# Patient Record
Sex: Female | Born: 1985 | Race: White | Hispanic: No | Marital: Single | Smoking: Former smoker
Health system: Southern US, Community
[De-identification: ages and names within clinical notes are randomized; demographics above are authoritative.]

## PROBLEM LIST (undated history)

## (undated) DIAGNOSIS — N390 Urinary tract infection, site not specified: Secondary | ICD-10-CM

## (undated) DIAGNOSIS — K8020 Calculus of gallbladder without cholecystitis without obstruction: Secondary | ICD-10-CM

## (undated) DIAGNOSIS — N83209 Unspecified ovarian cyst, unspecified side: Secondary | ICD-10-CM

## (undated) DIAGNOSIS — I10 Essential (primary) hypertension: Secondary | ICD-10-CM

## (undated) DIAGNOSIS — O24419 Gestational diabetes mellitus in pregnancy, unspecified control: Secondary | ICD-10-CM

## (undated) DIAGNOSIS — N200 Calculus of kidney: Secondary | ICD-10-CM

## (undated) HISTORY — DX: Gestational diabetes mellitus in pregnancy, unspecified control: O24.419

## (undated) HISTORY — PX: FINGER SURGERY: SHX640

---
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
## 2007-08-10 ENCOUNTER — Emergency Department (HOSPITAL_COMMUNITY): Admission: EM | Admit: 2007-08-10 | Discharge: 2007-08-10 | Payer: Self-pay | Admitting: Emergency Medicine

## 2010-01-14 HISTORY — PX: AMPUTATION FINGER / THUMB: SUR24

## 2010-06-14 ENCOUNTER — Emergency Department (HOSPITAL_COMMUNITY): Payer: Self-pay

## 2010-06-14 ENCOUNTER — Observation Stay (HOSPITAL_COMMUNITY)
Admission: EM | Admit: 2010-06-14 | Discharge: 2010-06-15 | Disposition: A | Discharge status: Home / Self Care | Payer: Self-pay | Attending: Orthopaedic Surgery | Admitting: Orthopaedic Surgery

## 2010-06-14 DIAGNOSIS — R112 Nausea with vomiting, unspecified: Secondary | ICD-10-CM | POA: Insufficient documentation

## 2010-06-14 DIAGNOSIS — S61409A Unspecified open wound of unspecified hand, initial encounter: Secondary | ICD-10-CM | POA: Insufficient documentation

## 2010-06-14 DIAGNOSIS — Y9239 Other specified sports and athletic area as the place of occurrence of the external cause: Secondary | ICD-10-CM | POA: Insufficient documentation

## 2010-06-14 DIAGNOSIS — Y998 Other external cause status: Secondary | ICD-10-CM | POA: Insufficient documentation

## 2010-06-14 DIAGNOSIS — S6710XA Crushing injury of unspecified finger(s), initial encounter: Principal | ICD-10-CM | POA: Insufficient documentation

## 2010-06-14 DIAGNOSIS — F1210 Cannabis abuse, uncomplicated: Secondary | ICD-10-CM | POA: Insufficient documentation

## 2010-06-14 DIAGNOSIS — K589 Irritable bowel syndrome without diarrhea: Secondary | ICD-10-CM | POA: Insufficient documentation

## 2010-06-14 DIAGNOSIS — S62609A Fracture of unspecified phalanx of unspecified finger, initial encounter for closed fracture: Secondary | ICD-10-CM | POA: Insufficient documentation

## 2010-06-14 LAB — PREGNANCY, URINE: Preg Test, Ur: POSITIVE

## 2010-06-15 ENCOUNTER — Other Ambulatory Visit: Payer: Self-pay | Admitting: Orthopaedic Surgery
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
## 2010-07-16 ENCOUNTER — Inpatient Hospital Stay (HOSPITAL_COMMUNITY): Payer: Medicaid Other

## 2010-07-16 ENCOUNTER — Inpatient Hospital Stay (HOSPITAL_COMMUNITY)
Admission: AD | Admit: 2010-07-16 | Discharge: 2010-07-16 | Disposition: A | Discharge status: Home / Self Care | Payer: Medicaid Other | Source: Ambulatory Visit | Attending: Obstetrics & Gynecology | Admitting: Obstetrics & Gynecology

## 2010-07-16 DIAGNOSIS — O209 Hemorrhage in early pregnancy, unspecified: Secondary | ICD-10-CM | POA: Insufficient documentation

## 2010-07-16 LAB — CBC
Hemoglobin: 13.7 g/dL (ref 12.0–15.0)
MCH: 32.3 pg (ref 26.0–34.0)
MCHC: 35.5 g/dL (ref 30.0–36.0)
RDW: 11.7 % (ref 11.5–15.5)

## 2010-07-16 LAB — WET PREP, GENITAL: Yeast Wet Prep HPF POC: NONE SEEN

## 2010-07-16 LAB — HCG, QUANTITATIVE, PREGNANCY: hCG, Beta Chain, Quant, S: 85991 m[IU]/mL — ABNORMAL HIGH (ref <5)

## 2010-07-16 LAB — ABO/RH: ABO/RH(D): A POS

## 2010-07-17 LAB — GC/CHLAMYDIA PROBE AMP, GENITAL: Chlamydia, DNA Probe: NEGATIVE

## 2010-08-14 LAB — HEPATITIS B SURFACE ANTIGEN: Hepatitis B Surface Ag: NEGATIVE

## 2010-10-12 LAB — URINALYSIS, ROUTINE W REFLEX MICROSCOPIC
Ketones, ur: >80 — AB
Nitrite: NEGATIVE
Protein, ur: 100 — AB
pH: 6.5

## 2010-10-12 LAB — POCT PREGNANCY, URINE: Preg Test, Ur: NEGATIVE

## 2010-10-12 LAB — POCT I-STAT, CHEM 8
Calcium, Ion: 1.1 — ABNORMAL LOW
Creatinine, Ser: 0.7
Glucose, Bld: 197 — ABNORMAL HIGH
Potassium: 3.5
Sodium: 140

## 2010-10-12 LAB — HEPATIC FUNCTION PANEL
Bilirubin, Direct: 0.2
Indirect Bilirubin: 0.8
Total Bilirubin: 1

## 2010-10-12 LAB — URINE CULTURE
Colony Count: NO GROWTH
Culture: NO GROWTH

## 2010-10-12 LAB — URINE MICROSCOPIC-ADD ON

## 2010-10-12 LAB — LIPASE, BLOOD: Lipase: 11

## 2011-01-06 ENCOUNTER — Encounter (HOSPITAL_COMMUNITY): Payer: Self-pay

## 2011-01-06 ENCOUNTER — Inpatient Hospital Stay (HOSPITAL_COMMUNITY)
Admission: AD | Admit: 2011-01-06 | Discharge: 2011-01-06 | Disposition: A | Discharge status: Home / Self Care | Payer: Medicaid Other | Source: Ambulatory Visit | Attending: Obstetrics and Gynecology | Admitting: Obstetrics and Gynecology

## 2011-01-06 ENCOUNTER — Other Ambulatory Visit: Payer: Self-pay | Admitting: Obstetrics and Gynecology

## 2011-01-06 DIAGNOSIS — O4700 False labor before 37 completed weeks of gestation, unspecified trimester: Secondary | ICD-10-CM | POA: Insufficient documentation

## 2011-01-06 DIAGNOSIS — R030 Elevated blood-pressure reading, without diagnosis of hypertension: Secondary | ICD-10-CM | POA: Insufficient documentation

## 2011-01-06 DIAGNOSIS — O139 Gestational [pregnancy-induced] hypertension without significant proteinuria, unspecified trimester: Secondary | ICD-10-CM

## 2011-01-06 DIAGNOSIS — O99891 Other specified diseases and conditions complicating pregnancy: Secondary | ICD-10-CM | POA: Insufficient documentation

## 2011-01-06 HISTORY — DX: Calculus of gallbladder without cholecystitis without obstruction: K80.20

## 2011-01-06 HISTORY — DX: Essential (primary) hypertension: I10

## 2011-01-06 HISTORY — DX: Urinary tract infection, site not specified: N39.0

## 2011-01-06 HISTORY — DX: Calculus of kidney: N20.0

## 2011-01-06 LAB — COMPREHENSIVE METABOLIC PANEL
CO2: 22 mEq/L (ref 19–32)
Calcium: 8.8 mg/dL (ref 8.4–10.5)
Creatinine, Ser: 0.55 mg/dL (ref 0.50–1.10)
GFR calc Af Amer: >90 mL/min (ref >90)
GFR calc non Af Amer: >90 mL/min (ref >90)
Glucose, Bld: 92 mg/dL (ref 70–99)
Total Bilirubin: 0.3 mg/dL (ref 0.3–1.2)

## 2011-01-06 LAB — FETAL FIBRONECTIN: Fetal Fibronectin: NEGATIVE

## 2011-01-06 LAB — URINALYSIS, ROUTINE W REFLEX MICROSCOPIC
Glucose, UA: NEGATIVE mg/dL
Hgb urine dipstick: NEGATIVE
Protein, ur: NEGATIVE mg/dL
pH: 7 (ref 5.0–8.0)

## 2011-01-06 LAB — CBC
HCT: 35 % — ABNORMAL LOW (ref 36.0–46.0)
Hemoglobin: 12.3 g/dL (ref 12.0–15.0)
MCH: 32.3 pg (ref 26.0–34.0)
MCV: 91.9 fL (ref 78.0–100.0)
RBC: 3.81 MIL/uL — ABNORMAL LOW (ref 3.87–5.11)

## 2011-01-06 LAB — WET PREP, GENITAL: Trich, Wet Prep: NONE SEEN

## 2011-01-06 LAB — URIC ACID: Uric Acid, Serum: 2.6 mg/dL (ref 2.4–7.0)

## 2011-01-06 MED ORDER — NIFEDIPINE 10 MG PO CAPS
20.0000 mg | ORAL_CAPSULE | Freq: Once | ORAL | Status: AC | PRN
  Administered 2011-01-06: 20 mg via ORAL
  Filled 2011-01-06: qty 1
  Filled 2011-01-06: qty 2

## 2011-01-06 MED ORDER — NIFEDIPINE 10 MG PO CAPS
20.0000 mg | ORAL_CAPSULE | Freq: Once | ORAL | Status: AC
  Administered 2011-01-06: 20 mg via ORAL
  Filled 2011-01-06: qty 2

## 2011-01-06 MED ORDER — NIFEDIPINE ER OSMOTIC RELEASE 30 MG PO TB24: 30.0000 mg | ORAL_TABLET | Freq: Every day | ORAL | Status: DC

## 2011-01-06 NOTE — ED Provider Notes (Signed)
History   25 yo G1P0 at 73 5/7 weeks presented c/o contractions all day, now less strong but just as frequent.  Reports has had contractions frequently over the last few weeks.  Denies leaking, bleeding, HA, visual symptoms, or epigastric pain.  Reports +FM.  Denies recent IC.  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  Chief Complaint  Patient presents with  . Contractions    OB History    Grav Para Term Preterm Abortions TAB SAB Ect Mult Living   1               Past Medical History  Diagnosis Date  . UTI (lower urinary tract infection)   . Kidney stones   . Gall bladder stones   . Hypertension     Past Surgical History  Procedure Date  . Finger surgery     Family History  Problem Relation Age of Onset  . Hypertension Mother   . Alcohol abuse Father   . Diabetes Maternal Grandmother   . Arthritis Paternal Grandmother     History  Substance Use Topics  . Smoking status: Former Smoker -- 0.2 packs/day    Types: Cigarettes    Quit date: 01/05/2006  . Smokeless tobacco: Not on file  . Alcohol Use: No    Allergies: No Known Allergies  Prescriptions prior to admission  Medication Sig Dispense Refill  . calcium carbonate (TUMS - DOSED IN MG ELEMENTAL CALCIUM) 500 MG chewable tablet Chew 1 tablet by mouth daily.        . prenatal vitamin w/FE, FA (PRENATAL 1 + 1) 27-1 MG TABS Take 1 tablet by mouth daily.           Physical Exam   Blood pressure 152/102, pulse 103, temperature 98.4 F (36.9 C), resp. rate 16, height 4\' 11"  (1.499 m), weight 66.679 kg (147 lb).  Filed Vitals:   01/06/11 1506 01/06/11 1529 01/06/11 1540  BP: 154/67 152/100 152/102  Pulse: 100 97 103  Temp: 98.4 F (36.9 C)    Resp: 16 16   Height: 4\' 11"  (1.499 m)    Weight: 66.679 kg (147 lb)       Chest clear Heart RRR without murmur Abd gravid, NT Pelvic--thin white d/c in vault, cervix posterior, closed, vtx -2. Ext  DTR 2+ without clonus, no edema  FHR reactive, no decels UCs q 4 minutes, with some milder contractions between.     ED Course  IUP at 33 5/7 weeks PTL, without cervical change Elevated BP  Consulted with Dr. Estanislado Pandy. Procardia 20 mg po now--repeat q 20-30 minutes x 2 doses if contractions persist. GC, chlamydia, FFN, wet prep done. UA pending, culture sent. PIH labs  Nigel Bridgeman, CNM, [place] 01/06/11 4:15p

## 2011-01-06 NOTE — Progress Notes (Signed)
Onset of contraction since this morning, had some a few weeks ago, painful and regular, every 3 minutes for past hour, no vaginal bleeding , no discharge.

## 2011-01-06 NOTE — Progress Notes (Signed)
Pt says she had contractions@ 0530 this morning that felt strong menstrual cramps.  Now she says they are not strong but coming every three minutes.  No leaking or bleeding.

## 2011-01-06 NOTE — ED Provider Notes (Signed)
Late note: Received 3 doses of Procardia, with diminishing of UCs after 3rd dose.  Has moderate headache now. Desires to leave [facility]U and get something to eat.  Patient found walking down the hall with partner.  FHR reactive UCs largely resolved to irritability.  FFN negative. PIH labs WNL, except SGPT 36. Uric acid 2.6.  BPs were 130-140/67-76 after Procardia.  Consulted with Dr. Estanislado Pandy. D/C home with Rx for Procardia 30 mg XL, 1 po qday (start tomorrow) PIH and PTL precautions reviewed. Start 24 hour urine 01/08/11, and bring to office visit already scheduled on 01/09/11.  Nigel Bridgeman, CNM, [place] 01/06/11  11p

## 2011-01-07 LAB — URINE CULTURE
Colony Count: NO GROWTH
Culture  Setup Time: 201212232258
Culture: NO GROWTH
Special Requests: NORMAL

## 2011-01-10 LAB — CULTURE, BETA STREP (GROUP B ONLY): Special Requests: NORMAL

## 2011-01-11 ENCOUNTER — Encounter (HOSPITAL_COMMUNITY): Payer: Self-pay | Admitting: *Deleted

## 2011-01-11 ENCOUNTER — Inpatient Hospital Stay (HOSPITAL_COMMUNITY)
Admission: AD | Admit: 2011-01-11 | Discharge: 2011-01-12 | DRG: 782 | Disposition: A | Discharge status: Home / Self Care | Payer: Medicaid Other | Source: Ambulatory Visit | Attending: Obstetrics and Gynecology | Admitting: Obstetrics and Gynecology

## 2011-01-11 DIAGNOSIS — O139 Gestational [pregnancy-induced] hypertension without significant proteinuria, unspecified trimester: Principal | ICD-10-CM | POA: Diagnosis present

## 2011-01-11 DIAGNOSIS — O1490 Unspecified pre-eclampsia, unspecified trimester: Secondary | ICD-10-CM

## 2011-01-11 LAB — URINALYSIS, ROUTINE W REFLEX MICROSCOPIC
Glucose, UA: NEGATIVE mg/dL
Ketones, ur: 15 mg/dL — AB
Nitrite: NEGATIVE
Specific Gravity, Urine: 1.015 (ref 1.005–1.030)
pH: 7 (ref 5.0–8.0)

## 2011-01-11 LAB — URINE MICROSCOPIC-ADD ON

## 2011-01-11 LAB — CBC
Hemoglobin: 12.6 g/dL (ref 12.0–15.0)
MCH: 32.3 pg (ref 26.0–34.0)
Platelets: 304 10*3/uL (ref 150–400)
RBC: 3.9 MIL/uL (ref 3.87–5.11)
WBC: 11.9 10*3/uL — ABNORMAL HIGH (ref 4.0–10.5)

## 2011-01-11 LAB — LACTATE DEHYDROGENASE: LDH: 215 U/L (ref 94–250)

## 2011-01-11 LAB — DIFFERENTIAL
Eosinophils Absolute: 0.1 10*3/uL (ref 0.0–0.7)
Lymphocytes Relative: 16 % (ref 12–46)
Lymphs Abs: 1.9 10*3/uL (ref 0.7–4.0)
Monocytes Relative: 9 % (ref 3–12)
Neutrophils Relative %: 74 % (ref 43–77)

## 2011-01-11 LAB — COMPREHENSIVE METABOLIC PANEL
ALT: 52 U/L — ABNORMAL HIGH (ref 0–35)
AST: 35 U/L (ref 0–37)
Calcium: 8.9 mg/dL (ref 8.4–10.5)
Creatinine, Ser: 0.58 mg/dL (ref 0.50–1.10)
GFR calc Af Amer: >90 mL/min (ref >90)
Glucose, Bld: 93 mg/dL (ref 70–99)
Sodium: 137 mEq/L (ref 135–145)
Total Protein: 6.4 g/dL (ref 6.0–8.3)

## 2011-01-11 MED ORDER — PRENATAL MULTIVITAMIN CH
1.0000 | ORAL_TABLET | Freq: Every day | ORAL | Status: DC
  Administered 2011-01-12: 1 via ORAL
  Filled 2011-01-11: qty 1

## 2011-01-11 MED ORDER — NIFEDIPINE ER OSMOTIC RELEASE 30 MG PO TB24
30.0000 mg | ORAL_TABLET | Freq: Once | ORAL | Status: AC
  Administered 2011-01-11: 30 mg via ORAL
  Filled 2011-01-11: qty 1

## 2011-01-11 MED ORDER — ACETAMINOPHEN 325 MG PO TABS
650.0000 mg | ORAL_TABLET | ORAL | Status: DC | PRN
  Administered 2011-01-12: 650 mg via ORAL
  Filled 2011-01-11: qty 2

## 2011-01-11 MED ORDER — CALCIUM CARBONATE ANTACID 500 MG PO CHEW: 2.0000 | CHEWABLE_TABLET | ORAL | Status: DC | PRN

## 2011-01-11 MED ORDER — NIFEDIPINE ER 60 MG PO TB24
60.0000 mg | ORAL_TABLET | Freq: Every day | ORAL | Status: DC
  Administered 2011-01-12: 60 mg via ORAL
  Filled 2011-01-11: qty 2
  Filled 2011-01-11: qty 1

## 2011-01-11 MED ORDER — DOCUSATE SODIUM 100 MG PO CAPS
100.0000 mg | ORAL_CAPSULE | Freq: Every day | ORAL | Status: DC
  Administered 2011-01-12: 100 mg via ORAL
  Filled 2011-01-11: qty 1

## 2011-01-11 MED ORDER — LACTATED RINGERS IV SOLN
INTRAVENOUS | Status: DC
  Administered 2011-01-11 – 2011-01-12 (×2): via INTRAVENOUS

## 2011-01-11 MED ORDER — ZOLPIDEM TARTRATE 10 MG PO TABS
10.0000 mg | ORAL_TABLET | Freq: Every evening | ORAL | Status: DC | PRN
  Administered 2011-01-11: 10 mg via ORAL
  Filled 2011-01-11: qty 1

## 2011-01-11 NOTE — Progress Notes (Signed)
Patient states BP was elevated at routine office visit today and was sent here for follow up. States she did a 24 hour urine last week and the results came back normal yesterday.

## 2011-01-11 NOTE — Progress Notes (Signed)
Heather Daugherty is a 25 y.o. G1P0000 at [redacted]w[redacted]d for PIH evaluation sent from office with bp 144/106  148/104, denies ha, visual spots or blurring, no uc, srom, or vag bleeding, with swelling to hands only with +FM. BPP 8/8 today at office, on procardia xl give for contractions now increased dosage to 60 mg daily  Pregnancy complications elevated bp, 24 hour urine with protein 163 on 12/26 Problem list: Hx kidney stones Hx gall bladder stones Hx UTI   Objective: BP 136/94  Pulse 101  Temp(Src) 97.6 F (36.4 C) (Oral)  Resp 20      Abdomen: soft, gravid, nontender, BX x4 quad Uterine fundus: soft, nontender Skin & Color: warm and dry  Neurological: AOx3, DTRs +3 no clonus EXT: negative Homan's b/l, no edema  FHT:nst reactive UC:   regular, every 2-3 minutes mild SVE:      Labs: Lab Results  Component Value Date   WBC 11.9* 01/11/2011   HGB 12.6 01/11/2011   HCT 35.5* 01/11/2011   MCV 91.0 01/11/2011   PLT 304 01/11/2011   Results for orders placed during the hospital encounter of 01/11/11 (from the past 24 hour(s))  URINALYSIS, ROUTINE W REFLEX MICROSCOPIC     Status: Abnormal   Collection Time   01/11/11  1:45 PM      Component Value Range   Color, Urine YELLOW  YELLOW    APPearance HAZY (*) CLEAR    Specific Gravity, Urine 1.015  1.005 - 1.030    pH 7.0  5.0 - 8.0    Glucose, UA NEGATIVE  NEGATIVE (mg/dL)   Hgb urine dipstick NEGATIVE  NEGATIVE    Bilirubin Urine NEGATIVE  NEGATIVE    Ketones, ur 15 (*) NEGATIVE (mg/dL)   Protein, ur NEGATIVE  NEGATIVE (mg/dL)   Urobilinogen, UA 1.0  0.0 - 1.0 (mg/dL)   Nitrite NEGATIVE  NEGATIVE    Leukocytes, UA SMALL (*) NEGATIVE   URINE MICROSCOPIC-ADD ON     Status: Abnormal   Collection Time   01/11/11  1:45 PM      Component Value Range   Squamous Epithelial / LPF MANY (*) RARE    WBC, UA 3-6  <3 (WBC/hpf)   Bacteria, UA MANY (*) RARE   COMPREHENSIVE METABOLIC PANEL     Status: Abnormal   Collection Time   01/11/11   1:49 PM      Component Value Range   Sodium 137  135 - 145 (mEq/L)   Potassium 3.5  3.5 - 5.1 (mEq/L)   Chloride 103  96 - 112 (mEq/L)   CO2 24  19 - 32 (mEq/L)   Glucose, Bld 93  70 - 99 (mg/dL)   BUN 6  6 - 23 (mg/dL)   Creatinine, Ser 1.61  0.50 - 1.10 (mg/dL)   Calcium 8.9  8.4 - 09.6 (mg/dL)   Total Protein 6.4  6.0 - 8.3 (g/dL)   Albumin 2.6 (*) 3.5 - 5.2 (g/dL)   AST 35  0 - 37 (U/L)   ALT 52 (*) 0 - 35 (U/L)   Alkaline Phosphatase 141 (*) 39 - 117 (U/L)   Total Bilirubin 0.4  0.3 - 1.2 (mg/dL)   GFR calc non Af Amer >90  >90 (mL/min)   GFR calc Af Amer >90  >90 (mL/min)  URIC ACID     Status: Normal   Collection Time   01/11/11  1:49 PM      Component Value Range   Uric Acid, Serum 3.1  2.4 - 7.0 (mg/dL)  CBC     Status: Abnormal   Collection Time   01/11/11  1:49 PM      Component Value Range   WBC 11.9 (*) 4.0 - 10.5 (K/uL)   RBC 3.90  3.87 - 5.11 (MIL/uL)   Hemoglobin 12.6  12.0 - 15.0 (g/dL)   HCT 82.9 (*) 56.2 - 46.0 (%)   MCV 91.0  78.0 - 100.0 (fL)   MCH 32.3  26.0 - 34.0 (pg)   MCHC 35.5  30.0 - 36.0 (g/dL)   RDW 13.0  86.5 - 78.4 (%)   Platelets 304  150 - 400 (K/uL)  DIFFERENTIAL     Status: Abnormal   Collection Time   01/11/11  1:49 PM      Component Value Range   Neutrophils Relative 74  43 - 77 (%)   Neutro Abs 8.8 (*) 1.7 - 7.7 (K/uL)   Lymphocytes Relative 16  12 - 46 (%)   Lymphs Abs 1.9  0.7 - 4.0 (K/uL)   Monocytes Relative 9  3 - 12 (%)   Monocytes Absolute 1.0  0.1 - 1.0 (K/uL)   Eosinophils Relative 1  0 - 5 (%)   Eosinophils Absolute 0.1  0.0 - 0.7 (K/uL)   Basophils Relative 0  0 - 1 (%)   Basophils Absolute 0.0  0.0 - 0.1 (K/uL)  LACTATE DEHYDROGENASE     Status: Normal   Collection Time   01/11/11  1:49 PM      Component Value Range   LD 215  94 - 250 (U/L)   Results for orders placed during the hospital encounter of 01/11/11 (from the past 24 hour(s))  URINALYSIS, ROUTINE W REFLEX MICROSCOPIC     Status: Abnormal    Collection Time   01/11/11  1:45 PM      Component Value Range   Color, Urine YELLOW  YELLOW    APPearance HAZY (*) CLEAR    Specific Gravity, Urine 1.015  1.005 - 1.030    pH 7.0  5.0 - 8.0    Glucose, UA NEGATIVE  NEGATIVE (mg/dL)   Hgb urine dipstick NEGATIVE  NEGATIVE    Bilirubin Urine NEGATIVE  NEGATIVE    Ketones, ur 15 (*) NEGATIVE (mg/dL)   Protein, ur NEGATIVE  NEGATIVE (mg/dL)   Urobilinogen, UA 1.0  0.0 - 1.0 (mg/dL)   Nitrite NEGATIVE  NEGATIVE    Leukocytes, UA SMALL (*) NEGATIVE   URINE MICROSCOPIC-ADD ON     Status: Abnormal   Collection Time   01/11/11  1:45 PM      Component Value Range   Squamous Epithelial / LPF MANY (*) RARE    WBC, UA 3-6  <3 (WBC/hpf)   Bacteria, UA MANY (*) RARE   COMPREHENSIVE METABOLIC PANEL     Status: Abnormal   Collection Time   01/11/11  1:49 PM      Component Value Range   Sodium 137  135 - 145 (mEq/L)   Potassium 3.5  3.5 - 5.1 (mEq/L)   Chloride 103  96 - 112 (mEq/L)   CO2 24  19 - 32 (mEq/L)   Glucose, Bld 93  70 - 99 (mg/dL)   BUN 6  6 - 23 (mg/dL)   Creatinine, Ser 6.96  0.50 - 1.10 (mg/dL)   Calcium 8.9  8.4 - 29.5 (mg/dL)   Total Protein 6.4  6.0 - 8.3 (g/dL)   Albumin 2.6 (*) 3.5 - 5.2 (g/dL)   AST  35  0 - 37 (U/L)   ALT 52 (*) 0 - 35 (U/L)   Alkaline Phosphatase 141 (*) 39 - 117 (U/L)   Total Bilirubin 0.4  0.3 - 1.2 (mg/dL)   GFR calc non Af Amer >90  >90 (mL/min)   GFR calc Af Amer >90  >90 (mL/min)  URIC ACID     Status: Normal   Collection Time   01/11/11  1:49 PM      Component Value Range   Uric Acid, Serum 3.1  2.4 - 7.0 (mg/dL)  CBC     Status: Abnormal   Collection Time   01/11/11  1:49 PM      Component Value Range   WBC 11.9 (*) 4.0 - 10.5 (K/uL)   RBC 3.90  3.87 - 5.11 (MIL/uL)   Hemoglobin 12.6  12.0 - 15.0 (g/dL)   HCT 16.1 (*) 09.6 - 46.0 (%)   MCV 91.0  78.0 - 100.0 (fL)   MCH 32.3  26.0 - 34.0 (pg)   MCHC 35.5  30.0 - 36.0 (g/dL)   RDW 04.5  40.9 - 81.1 (%)   Platelets 304  150 - 400  (K/uL)  DIFFERENTIAL     Status: Abnormal   Collection Time   01/11/11  1:49 PM      Component Value Range   Neutrophils Relative 74  43 - 77 (%)   Neutro Abs 8.8 (*) 1.7 - 7.7 (K/uL)   Lymphocytes Relative 16  12 - 46 (%)   Lymphs Abs 1.9  0.7 - 4.0 (K/uL)   Monocytes Relative 9  3 - 12 (%)   Monocytes Absolute 1.0  0.1 - 1.0 (K/uL)   Eosinophils Relative 1  0 - 5 (%)   Eosinophils Absolute 0.1  0.0 - 0.7 (K/uL)   Basophils Relative 0  0 - 1 (%)   Basophils Absolute 0.0  0.0 - 0.1 (K/uL)  LACTATE DEHYDROGENASE     Status: Normal   Collection Time   01/11/11  1:49 PM      Component Value Range   LD 215  94 - 250 (U/L)    Assessment and Plan:  does not have a problem list on file. 34 3/7 week IUP Elevated bp   [facility], Yahira Timberman 01/11/2011, 3:23 PM
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
## 2011-01-12 LAB — COMPREHENSIVE METABOLIC PANEL
ALT: 48 U/L — ABNORMAL HIGH (ref 0–35)
AST: 27 U/L (ref 0–37)
Albumin: 2.4 g/dL — ABNORMAL LOW (ref 3.5–5.2)
Alkaline Phosphatase: 129 U/L — ABNORMAL HIGH (ref 39–117)
Chloride: 106 mEq/L (ref 96–112)
Potassium: 3.2 mEq/L — ABNORMAL LOW (ref 3.5–5.1)
Sodium: 138 mEq/L (ref 135–145)
Total Bilirubin: 0.4 mg/dL (ref 0.3–1.2)

## 2011-01-12 LAB — CBC
HCT: 33.1 % — ABNORMAL LOW (ref 36.0–46.0)
MCH: 32.3 pg (ref 26.0–34.0)
MCHC: 35.6 g/dL (ref 30.0–36.0)
MCV: 90.7 fL (ref 78.0–100.0)
RDW: 12.2 % (ref 11.5–15.5)
WBC: 11.5 10*3/uL — ABNORMAL HIGH (ref 4.0–10.5)

## 2011-01-12 LAB — DIFFERENTIAL
Basophils Absolute: 0 10*3/uL (ref 0.0–0.1)
Eosinophils Relative: 1 % (ref 0–5)
Lymphocytes Relative: 16 % (ref 12–46)
Monocytes Absolute: 1.1 10*3/uL — ABNORMAL HIGH (ref 0.1–1.0)

## 2011-01-12 LAB — CREATININE CLEARANCE, URINE, 24 HOUR
Collection Interval-CRCL: 24 hours
Creatinine, Urine: 56.79 mg/dL
Urine Total Volume-CRCL: 2250 mL

## 2011-01-12 MED ORDER — NIFEDIPINE ER 60 MG PO TB24: 60.0000 mg | ORAL_TABLET | Freq: Every day | ORAL | Status: DC

## 2011-01-12 NOTE — Progress Notes (Signed)
Discharge instructions reviewed with pt. Discharge papers signed. Prescription given. D/C'd from hospital via wheelchair, significant other present.

## 2011-01-12 NOTE — Discharge Summary (Signed)
Physician Discharge Summary  Patient ID: Heather Daugherty MRN: [id number] DOB/AGE: Jun 08, 1985 25 y.o.  Admit date: 01/11/2011 Discharge date: 01/12/2011  Admission Diagnoses:  Elevated BP at 34 3/7 weeks  Discharge Diagnoses:  Elevated BP Gestational Hypertension without evidence of pre-eclampsia Mild elevation of SGPT  Discharged Condition: stable  Hospital Course: Admitted from the office on 01/11/11 with elevated BP, for 24 hour urine.  Patient was already on Procardia XL 30 mg q day for preterm labor, as well as mildly elevated BP.  PIH labs showed mild elevation of SGPT, but remained stable on repeat.  NST remained reactive, with no significant contractions.  24 hour urine was resulted late on the evening of 01/12/11, with 180 mg protein/24 hour.  She was discharged home per Dr. Su Hilt, with gestational hypertension precautions.  She is to return on Monday, 01/14/11, for repeat [facility] labs, then start a 24 hour urine on Thursday, 01/17/11--return to office on Friday, 01/18/11, for BPP and BP re-check.  Consults: NA  Significant Diagnostic Studies: labs:  Results for orders placed during the hospital encounter of 01/11/11 (from the past 24 hour(s))  COMPREHENSIVE METABOLIC PANEL     Status: Abnormal   Collection Time   01/12/11 12:03 PM      Component Value Range   Sodium 138  135 - 145 (mEq/L)   Potassium 3.2 (*) 3.5 - 5.1 (mEq/L)   Chloride 106  96 - 112 (mEq/L)   CO2 24  19 - 32 (mEq/L)   Glucose, Bld 106 (*) 70 - 99 (mg/dL)   BUN 5 (*) 6 - 23 (mg/dL)   Creatinine, Ser 8.29  0.50 - 1.10 (mg/dL)   Calcium 8.9  8.4 - 56.2 (mg/dL)   Total Protein 6.0  6.0 - 8.3 (g/dL)   Albumin 2.4 (*) 3.5 - 5.2 (g/dL)   AST 27  0 - 37 (U/L)   ALT 48 (*) 0 - 35 (U/L)   Alkaline Phosphatase 129 (*) 39 - 117 (U/L)   Total Bilirubin 0.4  0.3 - 1.2 (mg/dL)   GFR calc non Af Amer >90  >90 (mL/min)   GFR calc Af Amer >90  >90 (mL/min)  URIC ACID     Status: Normal   Collection Time   01/12/11 12:03  PM      Component Value Range   Uric Acid, Serum 2.4  2.4 - 7.0 (mg/dL)  CBC     Status: Abnormal   Collection Time   01/12/11 12:03 PM      Component Value Range   WBC 11.5 (*) 4.0 - 10.5 (K/uL)   RBC 3.65 (*) 3.87 - 5.11 (MIL/uL)   Hemoglobin 11.8 (*) 12.0 - 15.0 (g/dL)   HCT 13.0 (*) 86.5 - 46.0 (%)   MCV 90.7  78.0 - 100.0 (fL)   MCH 32.3  26.0 - 34.0 (pg)   MCHC 35.6  30.0 - 36.0 (g/dL)   RDW 78.4  69.6 - 29.5 (%)   Platelets 281  150 - 400 (K/uL)  DIFFERENTIAL     Status: Abnormal   Collection Time   01/12/11 12:03 PM      Component Value Range   Neutrophils Relative 73  43 - 77 (%)   Neutro Abs 8.4 (*) 1.7 - 7.7 (K/uL)   Lymphocytes Relative 16  12 - 46 (%)   Lymphs Abs 1.9  0.7 - 4.0 (K/uL)   Monocytes Relative 10  3 - 12 (%)   Monocytes Absolute 1.1 (*) 0.1 - 1.0 (  K/uL)   Eosinophils Relative 1  0 - 5 (%)   Eosinophils Absolute 0.1  0.0 - 0.7 (K/uL)   Basophils Relative 0  0 - 1 (%)   Basophils Absolute 0.0  0.0 - 0.1 (K/uL)  LACTATE DEHYDROGENASE     Status: Normal   Collection Time   01/12/11 12:03 PM      Component Value Range   LD 150  94 - 250 (U/L)  CREATININE CLEARANCE, URINE, 24 HOUR     Status: Abnormal   Collection Time   01/12/11  7:40 PM      Component Value Range   Urine Total Volume-CRCL 2250     Collection Interval-CRCL 24     Creatinine, Urine 56.79     Creatinine 0.52  0.50 - 1.10 (mg/dL)   Creatinine, 16X Ur 0960  700 - 1800 (mg/day)   Creatinine Clearance 171 (*) 75 - 115 (mL/min)  PROTEIN, URINE, 24 HOUR     Status: Abnormal (Preliminary result)   Collection Time   01/12/11  7:40 PM      Component Value Range   Urine Total Volume-UPROT 2250     Collection Interval-UPROT 24     Protein, Urine PENDING     Protein, 24H Urine 180 (*) 50 - 100 (mg/day)     Treatments: NA  Discharge Exam: Blood pressure 130/80, pulse 104, temperature 98.3 F (36.8 C), temperature source Oral, resp. rate 19, height 4\' 11"  (1.499 m), weight 64.411 kg  (142 lb).  General appearance: alert Head: Normocephalic, without obvious abnormality, atraumatic Resp: clear to auscultation bilaterally Cardio: regular rate and rhythm, S1, S2 normal, no murmur, click, rub or gallop GI: soft, non-tender; bowel sounds normal; no masses,  no organomegaly Extremities: extremities normal, atraumatic, no cyanosis or edema  Disposition: Home or Self Care  Medication List  As of 01/12/2011 11:32 PM   CHANGE how you take these medications         NIFEdipine 60 MG 24 hr tablet   Commonly known as: PROCARDIA-XL/ADALAT CC   Take 1 tablet (60 mg total) by mouth daily.   What changed: - medication strength - dose         CONTINUE taking these medications         calcium carbonate 500 MG chewable tablet   Commonly known as: TUMS - dosed in mg elemental calcium      prenatal vitamin w/FE, FA 27-1 MG Tabs          Where to get your medications    These are the prescriptions that you need to pick up.   You may get these medications from any pharmacy.         NIFEdipine 60 MG 24 hr tablet           Follow-up Information    Follow up with [facility] on 01/14/2011. (Come to the office on Monday, 01/14/11 for repeat blood work.  Office will schedule another visit for you on Friday, 01/18/11 for biophysical profile and 24 hour urine.  Start the 24 hour urine on Thursday, 01/17/11.)          Signed: Nigel Bridgeman 01/12/2011, 11:32 PM

## 2011-01-12 NOTE — Progress Notes (Addendum)
Heather Daugherty is a 25 y.o. G1P0000 at [redacted]w[redacted]d [facility]  Hospital Day No:1  Subjective: denie ha, visual spots or blurring, no swelling, no contra   Pregnancy complications: pre-eclampsia  Objective: BP 112/93  Pulse 105  Temp(Src) 98 F (36.7 C) (Oral)  Resp 20  Ht 4\' 11"  (1.499 m)  Wt 142 lb (64.411 kg)  BMI 28.68 kg/m2      Physical Exam:  Gen: alert, cooperative Chest/Lungs: cta bilaterally  Heart/Pulse: RRR  Abdomen: soft, gravid, nontender, BX x4 quad Uterine fundus: soft, nontender Skin & Color: warm and dry  Neurological: AOx3, DTRs +2 EXT: negative Homan's b/l, no edema  FHT:  NST reactive few contractions SVE:   Dilation: Closed Effacement (%): 70 Exam by:: Hassell Halim, CNM  Labs: Lab Results  Component Value Date   WBC 11.9* 01/11/2011   HGB 12.6 01/11/2011   HCT 35.5* 01/11/2011   MCV 91.0 01/11/2011   PLT 304 01/11/2011    Assessment and Plan: 34 34/7 week IUP Pre eclampsia Eval with elevated BPs P labs for 1200 PIH panel, NST q shift  KREBSBACH, MARY 01/12/2011, 10:43 AM  Agree with above. 24hr urine pending. - AYR

## 2011-01-13 LAB — PROTEIN, URINE, 24 HOUR
Protein, 24H Urine: 180 mg/d — ABNORMAL HIGH (ref 50–100)
Protein, Urine: 8 mg/dL

## 2011-01-14 ENCOUNTER — Inpatient Hospital Stay (HOSPITAL_COMMUNITY)
Admission: AD | Admit: 2011-01-14 | Discharge: 2011-01-14 | Disposition: A | Discharge status: Home / Self Care | Payer: Medicaid Other | Source: Ambulatory Visit | Attending: Obstetrics and Gynecology | Admitting: Obstetrics and Gynecology

## 2011-01-14 ENCOUNTER — Encounter (HOSPITAL_COMMUNITY): Payer: Self-pay | Admitting: *Deleted

## 2011-01-14 DIAGNOSIS — O139 Gestational [pregnancy-induced] hypertension without significant proteinuria, unspecified trimester: Secondary | ICD-10-CM | POA: Insufficient documentation

## 2011-01-14 DIAGNOSIS — O4700 False labor before 37 completed weeks of gestation, unspecified trimester: Secondary | ICD-10-CM | POA: Insufficient documentation

## 2011-01-14 LAB — URINALYSIS, ROUTINE W REFLEX MICROSCOPIC
Hgb urine dipstick: NEGATIVE
Nitrite: NEGATIVE
Specific Gravity, Urine: 1.01 (ref 1.005–1.030)
Urobilinogen, UA: 0.2 mg/dL (ref 0.0–1.0)

## 2011-01-14 LAB — CBC
HCT: 33.9 % — ABNORMAL LOW (ref 36.0–46.0)
Hemoglobin: 11.9 g/dL — ABNORMAL LOW (ref 12.0–15.0)
MCH: 32.1 pg (ref 26.0–34.0)
MCHC: 35.1 g/dL (ref 30.0–36.0)

## 2011-01-14 LAB — COMPREHENSIVE METABOLIC PANEL
ALT: 39 U/L — ABNORMAL HIGH (ref 0–35)
AST: 24 U/L (ref 0–37)
Calcium: 9.9 mg/dL (ref 8.4–10.5)
GFR calc Af Amer: >90 mL/min (ref >90)
Sodium: 136 mEq/L (ref 135–145)
Total Protein: 5.8 g/dL — ABNORMAL LOW (ref 6.0–8.3)

## 2011-01-14 LAB — URIC ACID: Uric Acid, Serum: 2.5 mg/dL (ref 2.4–7.0)

## 2011-01-14 MED ORDER — NIFEDIPINE ER OSMOTIC RELEASE 30 MG PO TB24: 60.0000 mg | ORAL_TABLET | Freq: Every day | ORAL | Status: DC

## 2011-01-14 MED ORDER — PROMETHAZINE HCL 25 MG/ML IJ SOLN: 12.5000 mg | Freq: Once | INTRAMUSCULAR | Status: DC | PRN

## 2011-01-14 MED ORDER — NIFEDIPINE ER OSMOTIC RELEASE 30 MG PO TB24
60.0000 mg | ORAL_TABLET | Freq: Once | ORAL | Status: AC
  Administered 2011-01-14: 60 mg via ORAL
  Filled 2011-01-14: qty 2

## 2011-01-14 MED ORDER — PROMETHAZINE HCL 25 MG/ML IJ SOLN
12.5000 mg | Freq: Four times a day (QID) | INTRAMUSCULAR | Status: DC | PRN
  Administered 2011-01-14: 12.5 mg via INTRAVENOUS
  Filled 2011-01-14: qty 1

## 2011-01-14 MED ORDER — TERBUTALINE SULFATE 1 MG/ML IJ SOLN
0.2500 mg | Freq: Once | INTRAMUSCULAR | Status: AC
  Administered 2011-01-14: 0.25 mg via SUBCUTANEOUS
  Filled 2011-01-14: qty 1

## 2011-01-14 MED ORDER — HYDROXYZINE HCL 50 MG/ML IM SOLN
100.0000 mg | Freq: Once | INTRAMUSCULAR | Status: AC
  Administered 2011-01-14: 100 mg via INTRAMUSCULAR
  Filled 2011-01-14: qty 2

## 2011-01-14 MED ORDER — LACTATED RINGERS IV BOLUS (SEPSIS)
1000.0000 mL | Freq: Once | INTRAVENOUS | Status: AC
  Administered 2011-01-14: 1000 mL via INTRAVENOUS

## 2011-01-14 MED ORDER — HYDROXYZINE PAMOATE 50 MG PO CAPS: 50.0000 mg | ORAL_CAPSULE | Freq: Four times a day (QID) | ORAL | Status: AC | PRN

## 2011-01-14 NOTE — Progress Notes (Signed)
Pt C/O legs aching, being very restless & crampy - CNM notified, is ordering vistaril IM.

## 2011-01-14 NOTE — ED Notes (Signed)
24-hour urine supplies & instructions given, pt verbalizes understanding.

## 2011-01-14 NOTE — Progress Notes (Signed)
Pt states that she has been feeling contractions since 0130
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
## 2011-01-14 NOTE — Progress Notes (Addendum)
S: legs feel better after vistaril, occ feels ctx, has been sleeping, denies HA/N/V/RUQ pain, GFM O:  Filed Vitals:   01/14/11 0438 01/14/11 0731 01/14/11 0823 01/14/11 0907  BP: 128/73 137/80 132/78 133/80  Pulse: 87 97 95 102  Temp:      TempSrc:      Resp:      Height:      Weight:      SpO2:       Labs:  ALT - 24 AST - 39 - decreased from 2 days ago  All others WNL  Attempt to recheck cx, pt did not tolerate exam, unable to feel cx, or presenting part  A: IUP at [redacted]w[redacted]d GHTN vs. Pre-eclampsia - on procardia XL 60mg   Preterm ctx  Restless legs - likely reaction from phenergan - resolved after vistaril Elevated LFT - stable 24hr urine on 12/29 = 180mg  protein UA for protein today - neg  P: D/C home Begin 24hr urine collection tmrw and turn into office on Wednesday appt at office on Friday Continue bedrest at home [facility] Labor precautions PIH precautions  D/W Dr. Normand Sloop

## 2011-01-15 NOTE — L&D Delivery Note (Signed)
Delivery Note G1P0 37 1/7 week IUP induction for pre eclampsia with cervidil, pitocin, arm clear fluid. At 8:14 AM a viable female was delivered via Vaginal, Spontaneous Delivery (Presentation: Left Occiput Anterior), in lithotomy position, Dr. Normand Sloop attended delivery.APGAR: 8, 9; weight 7 lb 3.2 oz (3266 g).   Placenta status: Intact, Spontaneous, Schultze.  Cord: 3 vessels. Cytotec 1000 rectally. Continue Magnesium 2 gram per hour.   Anesthesia: Epidural  Episiotomy: None Lacerations: 2nd degree;Perineal Suture Repair: 3.0 monocryl Est. Blood Loss (mL): 500  Mom to AICU.  Baby to with mother.  Heather Daugherty 01/30/2011, 8:56 AM

## 2011-01-22 ENCOUNTER — Inpatient Hospital Stay (HOSPITAL_COMMUNITY)
Admission: AD | Admit: 2011-01-22 | Discharge: 2011-02-01 | DRG: 774 | Disposition: A | Discharge status: Home / Self Care | Payer: Medicaid Other | Source: Ambulatory Visit | Attending: Obstetrics and Gynecology | Admitting: Obstetrics and Gynecology

## 2011-01-22 ENCOUNTER — Encounter (HOSPITAL_COMMUNITY): Payer: Self-pay | Admitting: *Deleted

## 2011-01-22 DIAGNOSIS — O9903 Anemia complicating the puerperium: Secondary | ICD-10-CM | POA: Diagnosis not present

## 2011-01-22 DIAGNOSIS — O139 Gestational [pregnancy-induced] hypertension without significant proteinuria, unspecified trimester: Secondary | ICD-10-CM

## 2011-01-22 DIAGNOSIS — D649 Anemia, unspecified: Secondary | ICD-10-CM | POA: Diagnosis not present

## 2011-01-22 DIAGNOSIS — O1490 Unspecified pre-eclampsia, unspecified trimester: Secondary | ICD-10-CM | POA: Diagnosis present

## 2011-01-22 DIAGNOSIS — IMO0002 Reserved for concepts with insufficient information to code with codable children: Principal | ICD-10-CM | POA: Diagnosis present

## 2011-01-22 MED ORDER — DOCUSATE SODIUM 100 MG PO CAPS
100.0000 mg | ORAL_CAPSULE | Freq: Every day | ORAL | Status: DC
  Administered 2011-01-22 – 2011-01-28 (×7): 100 mg via ORAL
  Filled 2011-01-22 (×9): qty 1

## 2011-01-22 MED ORDER — PANTOPRAZOLE SODIUM 40 MG PO TBEC
40.0000 mg | DELAYED_RELEASE_TABLET | Freq: Every day | ORAL | Status: DC
  Administered 2011-01-22 – 2011-01-28 (×7): 40 mg via ORAL
  Filled 2011-01-22 (×12): qty 1

## 2011-01-22 MED ORDER — ACETAMINOPHEN 325 MG PO TABS
650.0000 mg | ORAL_TABLET | ORAL | Status: DC | PRN
  Administered 2011-01-23: 650 mg via ORAL
  Filled 2011-01-22: qty 2

## 2011-01-22 MED ORDER — ZOLPIDEM TARTRATE 10 MG PO TABS
10.0000 mg | ORAL_TABLET | Freq: Every evening | ORAL | Status: DC | PRN
  Administered 2011-01-22 – 2011-01-28 (×7): 10 mg via ORAL
  Filled 2011-01-22 (×7): qty 1

## 2011-01-22 MED ORDER — POTASSIUM CHLORIDE CRYS ER 20 MEQ PO TBCR
40.0000 meq | EXTENDED_RELEASE_TABLET | Freq: Every day | ORAL | Status: DC
  Administered 2011-01-22 – 2011-01-28 (×6): 40 meq via ORAL
  Filled 2011-01-22 (×9): qty 2

## 2011-01-22 MED ORDER — CALCIUM CARBONATE ANTACID 500 MG PO CHEW
2.0000 | CHEWABLE_TABLET | ORAL | Status: DC | PRN
  Filled 2011-01-22: qty 2

## 2011-01-22 MED ORDER — PRENATAL MULTIVITAMIN CH
1.0000 | ORAL_TABLET | Freq: Every day | ORAL | Status: DC
  Administered 2011-01-22 – 2011-01-28 (×7): 1 via ORAL
  Filled 2011-01-22 (×9): qty 1
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
## 2011-01-22 NOTE — Progress Notes (Signed)
Patient sent from the office for evaluation of elevated BP. Patient is having no pain, bleeding or leaking and reports good fetal movement.

## 2011-01-22 NOTE — Progress Notes (Signed)
Heather Daugherty is a 26 y.o. G1P0000 at [redacted]w[redacted]d admitted for elevated BP.  Subjective: Pt now transferred to antenatal and resting and BP has decreased w/ bedrest.  No PIH s/s.  S.o. Reports "her heart burn has been really bad."  GFM.  No LOF or abnl d/c.    Objective: BP 136/94  Pulse 97  Temp(Src) 99.2 F (37.3 C) (Oral)  Resp 18  Ht 4\' 11"  (1.499 m)  Wt 68.04 kg (150 lb)  BMI 30.30 kg/m2     .Marland Kitchen Filed Vitals:   01/22/11 1352 01/22/11 1407 01/22/11 1446 01/22/11 1502  BP: 140/103 143/100 146/98 136/94  Pulse: 104 96 97 97  Temp:   99.2 F (37.3 C)   TempSrc:   Oral   Resp:   18 18  Height:   4\' 11"  (1.499 m)   Weight:   68.04 kg (150 lb)    FHT:  FHR: 140 bpm, variability: moderate,  accelerations:  Present,  decelerations:  Absent UC:   Irregular--occ'l ctxs w/ intermittent UI SVE:   Dilation: 1.5 Effacement (%): 50 Exam by:: h.Uri Covey,cnm  Labs: [facility] called w/ PIH labs and 24 hr urine result just after 1500. CBC WNL:  WBC=11.8, Hgb=12.8, Hct=36.2, & Platelets=335; CMET WNL except K=3.3; Alkaline phosphatate=140; Albumin =2.6(L)  (AST=25, ALT=30); glucose=138 but unsure last meal time LDH=191 Uric acid=3.4  24 hr urine results: 24 hr total protein=435 (H) 24 hr urine volume=2,966ml 24hr creatinine=1,694(H) Total protein=15  Assessment / Plan: 1.  IUP at 36 weeks  2.  Mild Preeclampsia  3.  Cat I FHT  4. Hypokalemia  5. Normal LFTs  Preeclampsia:  mild preE Fetal Wellbeing:  Category I Anticipated MOD:  NSVD 1.  Per c/w dr. Su Hilt, admit as inpatient to remain hospitalized until delivery.  Consult w/ MFM r/e time of delivery. 2.  Daily PIH labs 3.  Continue Procardia XL 60mg  po qd 4.  KDur po qd until K level normal 5.  MD to follow 6.  BPP this Friday  Abram Sax H 01/22/2011, 3:50 PM

## 2011-01-23 ENCOUNTER — Inpatient Hospital Stay (HOSPITAL_COMMUNITY): Payer: Medicaid Other

## 2011-01-23 LAB — RPR: RPR: NONREACTIVE

## 2011-01-23 LAB — COMPREHENSIVE METABOLIC PANEL
Alkaline Phosphatase: 136 U/L — ABNORMAL HIGH (ref 39–117)
BUN: 6 mg/dL (ref 6–23)
CO2: 25 mEq/L (ref 19–32)
Chloride: 104 mEq/L (ref 96–112)
GFR calc Af Amer: >90 mL/min (ref >90)
GFR calc non Af Amer: >90 mL/min (ref >90)
Glucose, Bld: 86 mg/dL (ref 70–99)
Potassium: 3.5 mEq/L (ref 3.5–5.1)
Total Bilirubin: 0.3 mg/dL (ref 0.3–1.2)
Total Protein: 6 g/dL (ref 6.0–8.3)

## 2011-01-23 LAB — LACTATE DEHYDROGENASE: LDH: 151 U/L (ref 94–250)

## 2011-01-23 LAB — URIC ACID: Uric Acid, Serum: 2.5 mg/dL (ref 2.4–7.0)

## 2011-01-23 LAB — CBC
HCT: 33.8 % — ABNORMAL LOW (ref 36.0–46.0)
Hemoglobin: 12 g/dL (ref 12.0–15.0)
MCHC: 35.5 g/dL (ref 30.0–36.0)

## 2011-01-23 MED ORDER — HYDROCODONE-ACETAMINOPHEN 5-325 MG PO TABS
2.0000 | ORAL_TABLET | Freq: Once | ORAL | Status: AC
  Administered 2011-01-23: 2 via ORAL
  Filled 2011-01-23: qty 2

## 2011-01-23 MED ORDER — NIFEDIPINE ER 60 MG PO TB24
60.0000 mg | ORAL_TABLET | Freq: Every day | ORAL | Status: DC
  Administered 2011-01-23 – 2011-01-28 (×6): 60 mg via ORAL
  Filled 2011-01-23 (×8): qty 1

## 2011-01-23 NOTE — Progress Notes (Signed)
Monitors off. 

## 2011-01-23 NOTE — Progress Notes (Signed)
MFM CONSULTATION   Patient is 26 y/o/ G1 admitted with a diagnosis of preeclampsia.  Patient has experienced increased blood pressures for several weeks.  Serial evaluations of 24 hour urine have been negative until sample yesterday reveal 435 mg total protein.  Patient gives no prior history of HTN.  Procardia was initially started for symptomatic relief of preterm contractions but was recently changed to 60 mg XL daily for BP control.  Patient denies any symptoms of preeclampsia with the exception of peripheral edema.  On the day of admission, patient was noted to have markedly elevated BP in the range of 160/100.  Since admission, these have ranged predominantly 120-140's/70-100.  Preeclampsia labs are WNL.  Exam is unremarkable.  Recent ultrasound 12/28 revealed normal fetal growth (79th percentile) and normal amniotic fluid volume.  Fetal monitoring has been reported as category 1.   Ultrasound was performed today to assess amniotic fluid volume.  This was WNL.    Diagnosis of preeclampsia was discussed with the patient at length as well as management strategies.  Recommendations:  -continue close inpatient maternal and fetal surveillance -if maternal and fetal status remain stable, recommend delivery at 37 weeks -maintain low threshold to proceed with delivery  -deliver for any evidence of severe preeclampsia including persistent increases in SBP >160 or DBP>100, neurologic symptoms, epigastric pain, oliguria, lab abnormalities, hyperreflexia, or any maternal or fetal concerns -evaluate labs every other day for now with more frequent evaluations based on clinical status -would not increase antihypertensive therapy further, if BP continues to trend up, proceed with delivery -agree with plan to transition from continuous fetal monitoring to TID monitoring with further prolonged monitoring as indicated  Thanks for consult. Will see patient again at your request.  30 min

## 2011-01-23 NOTE — Progress Notes (Signed)
UR chart review completed.  

## 2011-01-23 NOTE — Progress Notes (Addendum)
Hospital day # 1 pregnancy at [redacted]w[redacted]d  S: Well, reports good fetal activity.  Denies HA, visual symptoms, epigastric pain.  Family at bedside.      Contractions: Very occasional, mild      Vaginal bleeding: None       Vaginal discharge: None  O: BP 131/87  Pulse 93  Temp(Src) 98.3 F (36.8 C) (Oral)  Resp 18  Ht 4\' 11"  (1.499 m)  Wt 68.04 kg (150 lb)  BMI 30.30 kg/m2  Filed Vitals:   01/23/11 0126 01/23/11 0549 01/23/11 0813 01/23/11 1005  BP: 131/87 127/75 143/102 131/87  Pulse: 102 94 87 93  Temp: 98.2 F (36.8 C) 98 F (36.7 C) 98.3 F (36.8 C)   TempSrc:  Oral Oral   Resp: 18 18 18 18   Height:      Weight:       Results for orders placed during the hospital encounter of 01/22/11 (from the past 24 hour(s))  CBC     Status: Abnormal   Collection Time   01/23/11  8:53 AM      Component Value Range   WBC 11.1 (*) 4.0 - 10.5 (K/uL)   RBC 3.73 (*) 3.87 - 5.11 (MIL/uL)   Hemoglobin 12.0  12.0 - 15.0 (g/dL)   HCT 47.8 (*) 29.5 - 46.0 (%)   MCV 90.6  78.0 - 100.0 (fL)   MCH 32.2  26.0 - 34.0 (pg)   MCHC 35.5  30.0 - 36.0 (g/dL)   RDW 62.1  30.8 - 65.7 (%)   Platelets 304  150 - 400 (K/uL)  COMPREHENSIVE METABOLIC PANEL     Status: Abnormal   Collection Time   01/23/11  8:53 AM      Component Value Range   Sodium 136  135 - 145 (mEq/L)   Potassium 3.5  3.5 - 5.1 (mEq/L)   Chloride 104  96 - 112 (mEq/L)   CO2 25  19 - 32 (mEq/L)   Glucose, Bld 86  70 - 99 (mg/dL)   BUN 6  6 - 23 (mg/dL)   Creatinine, Ser 8.46  0.50 - 1.10 (mg/dL)   Calcium 8.3 (*) 8.4 - 10.5 (mg/dL)   Total Protein 6.0  6.0 - 8.3 (g/dL)   Albumin 2.4 (*) 3.5 - 5.2 (g/dL)   AST 23  0 - 37 (U/L)   ALT 30  0 - 35 (U/L)   Alkaline Phosphatase 136 (*) 39 - 117 (U/L)   Total Bilirubin 0.3  0.3 - 1.2 (mg/dL)   GFR calc non Af Amer >90  >90 (mL/min)   GFR calc Af Amer >90  >90 (mL/min)  LACTATE DEHYDROGENASE     Status: Normal   Collection Time   01/23/11  8:53 AM      Component Value Range   LD 151  94 -  250 (U/L)  URIC ACID     Status: Normal   Collection Time   01/23/11  8:53 AM      Component Value Range   Uric Acid, Serum 2.5  2.4 - 7.0 (mg/dL)         Fetal tracings: Reviewed and reassuring      Uterus non-tender      Extremities: no significant edema and no signs of DVT, DTR 2-3+ without clonus.  A: [redacted]w[redacted]d with pre-eclampsia     Stable     Normal PIH labs (previous mild elevation of ALT)  P: Continue current plan of care.     Await MFM  consult.     Multiple questions reviewed with patient and family regarding plan of care.     [facility] MDs will follow.  Nigel Bridgeman, CNM, [place] 01/23/2011 10:19 AM    pt seen and agree with above.  Awaiting [place] and MFM input

## 2011-01-24 LAB — CBC
Hemoglobin: 11.2 g/dL — ABNORMAL LOW (ref 12.0–15.0)
MCHC: 34.8 g/dL (ref 30.0–36.0)
RBC: 3.52 MIL/uL — ABNORMAL LOW (ref 3.87–5.11)
WBC: 10 10*3/uL (ref 4.0–10.5)

## 2011-01-24 LAB — COMPREHENSIVE METABOLIC PANEL
ALT: 30 U/L (ref 0–35)
Alkaline Phosphatase: 137 U/L — ABNORMAL HIGH (ref 39–117)
Chloride: 106 mEq/L (ref 96–112)
GFR calc Af Amer: >90 mL/min (ref >90)
Glucose, Bld: 83 mg/dL (ref 70–99)
Potassium: 3.2 mEq/L — ABNORMAL LOW (ref 3.5–5.1)
Sodium: 136 mEq/L (ref 135–145)
Total Bilirubin: 0.2 mg/dL — ABNORMAL LOW (ref 0.3–1.2)
Total Protein: 5.6 g/dL — ABNORMAL LOW (ref 6.0–8.3)

## 2011-01-24 NOTE — Progress Notes (Signed)
26 y.o. year old female,at [redacted]w[redacted]d gestation.  SUBJECTIVE:  Patient denies headaches, blurred vision, and right upper quadrant pain.  OBJECTIVE:  BP 131/83  Pulse 82  Temp(Src) 98 F (36.7 C) (Oral)  Resp 18  Ht 4\' 11"  (1.499 m)  Wt 65.363 kg (144 lb 1.6 oz)  BMI 29.10 kg/m2  CBC    Component Value Date/Time   WBC 10.0 01/24/2011 0445   RBC 3.52* 01/24/2011 0445   HGB 11.2* 01/24/2011 0445   HCT 32.2* 01/24/2011 0445   PLT 311 01/24/2011 0445   MCV 91.5 01/24/2011 0445   MCH 31.8 01/24/2011 0445   MCHC 34.8 01/24/2011 0445   RDW 12.4 01/24/2011 0445   LYMPHSABS 1.9 01/12/2011 1203   MONOABS 1.1* 01/12/2011 1203   EOSABS 0.1 01/12/2011 1203   BASOSABS 0.0 01/12/2011 1203    CMP     Component Value Date/Time   NA 136 01/24/2011 0445   K 3.2* 01/24/2011 0445   CL 106 01/24/2011 0445   CO2 23 01/24/2011 0445   GLUCOSE 83 01/24/2011 0445   BUN 7 01/24/2011 0445   CREATININE 0.54 01/24/2011 0445   CREATININE 0.52 01/12/2011 1940   CALCIUM 7.9* 01/24/2011 0445   PROT 5.6* 01/24/2011 0445   ALBUMIN 2.2* 01/24/2011 0445   AST 21 01/24/2011 0445   ALT 30 01/24/2011 0445   ALKPHOS 137* 01/24/2011 0445   BILITOT 0.2* 01/24/2011 0445   GFRNONAA >90 01/24/2011 0445   GFRAA >90 01/24/2011 0445   Fetal Heart Tones:  Category 1  Contractions:  Few  Chest: Clear  Heart: Regular rate and rhythm  Abdomen: Gravid and nontender  Extremities: Normal  Reflexes: Normal  ASSESSMENT:  [redacted]w[redacted]d Weeks Pregnancy  Preeclampsia (stable)  PLAN:  We will continue hospital observation. The plan is to deliver the patient at 37 weeks if she remained stable until that time.  Leonard Schwartz, M.D.

## 2011-01-25 ENCOUNTER — Inpatient Hospital Stay (HOSPITAL_COMMUNITY): Payer: Medicaid Other

## 2011-01-25 LAB — COMPREHENSIVE METABOLIC PANEL
Alkaline Phosphatase: 134 U/L — ABNORMAL HIGH (ref 39–117)
BUN: 7 mg/dL (ref 6–23)
Chloride: 104 mEq/L (ref 96–112)
GFR calc Af Amer: >90 mL/min (ref >90)
GFR calc non Af Amer: >90 mL/min (ref >90)
Glucose, Bld: 79 mg/dL (ref 70–99)
Potassium: 3.8 mEq/L (ref 3.5–5.1)
Total Bilirubin: 0.3 mg/dL (ref 0.3–1.2)

## 2011-01-25 LAB — CBC
HCT: 32.5 % — ABNORMAL LOW (ref 36.0–46.0)
Hemoglobin: 11.3 g/dL — ABNORMAL LOW (ref 12.0–15.0)
WBC: 11.4 10*3/uL — ABNORMAL HIGH (ref 4.0–10.5)

## 2011-01-25 LAB — LACTATE DEHYDROGENASE: LDH: 172 U/L (ref 94–250)

## 2011-01-25 NOTE — Progress Notes (Signed)
Patient ID: Heather Daugherty, female   DOB: [birth year], 26 y.o.   MRN: [id number] Pt without complaints.  No leakage of fluid or VB.  Good FM.  Denies headache, visual changes or RUQ pain  BP 143/97  Pulse 89  Temp(Src) 98 F (36.7 C) (Oral)  Resp 20  Ht 4\' 11"  (1.499 m)  Wt 65.363 kg (144 lb 1.6 oz)  BMI 29.10 kg/m2  FHTS Baseline: 130 bpm  Toco irregular  Pt in NAD CV RRR Lungs CTAB abd  Gravid soft and NT GU no vb EXt no calf tenderness  2 puls DTR B, no clonus B Results for orders placed during the hospital encounter of 01/22/11 (from the past 72 hour(s))  HIV ANTIBODY (ROUTINE TESTING)     Status: Normal      Component Value Range Comment   HIV Non-reactive     RPR     Status: Normal      Component Value Range Comment   RPR Nonreactive     CBC     Status: Abnormal   Collection Time   01/23/11  8:53 AM      Component Value Range Comment   WBC 11.1 (*) 4.0 - 10.5 (K/uL)    RBC 3.73 (*) 3.87 - 5.11 (MIL/uL)    Hemoglobin 12.0  12.0 - 15.0 (g/dL)    HCT 40.9 (*) 81.1 - 46.0 (%)    MCV 90.6  78.0 - 100.0 (fL)    MCH 32.2  26.0 - 34.0 (pg)    MCHC 35.5  30.0 - 36.0 (g/dL)    RDW 91.4  78.2 - 95.6 (%)    Platelets 304  150 - 400 (K/uL)   COMPREHENSIVE METABOLIC PANEL     Status: Abnormal   Collection Time   01/23/11  8:53 AM      Component Value Range Comment   Sodium 136  135 - 145 (mEq/L)    Potassium 3.5  3.5 - 5.1 (mEq/L)    Chloride 104  96 - 112 (mEq/L)    CO2 25  19 - 32 (mEq/L)    Glucose, Bld 86  70 - 99 (mg/dL)    BUN 6  6 - 23 (mg/dL)    Creatinine, Ser 2.13  0.50 - 1.10 (mg/dL)    Calcium 8.3 (*) 8.4 - 10.5 (mg/dL)    Total Protein 6.0  6.0 - 8.3 (g/dL)    Albumin 2.4 (*) 3.5 - 5.2 (g/dL)    AST 23  0 - 37 (U/L)    ALT 30  0 - 35 (U/L)    Alkaline Phosphatase 136 (*) 39 - 117 (U/L)    Total Bilirubin 0.3  0.3 - 1.2 (mg/dL)    GFR calc non Af Amer >90  >90 (mL/min)    GFR calc Af Amer >90  >90 (mL/min)   LACTATE DEHYDROGENASE     Status: Normal   Collection Time   01/23/11  8:53 AM      Component Value Range Comment   LD 151  94 - 250 (U/L)   URIC ACID     Status: Normal   Collection Time   01/23/11  8:53 AM      Component Value Range Comment   Uric Acid, Serum 2.5  2.4 - 7.0 (mg/dL)   CBC     Status: Abnormal   Collection Time   01/24/11  4:45 AM      Component Value Range Comment   WBC 10.0  4.0 -  10.5 (K/uL)    RBC 3.52 (*) 3.87 - 5.11 (MIL/uL)    Hemoglobin 11.2 (*) 12.0 - 15.0 (g/dL)    HCT 96.0 (*) 45.4 - 46.0 (%)    MCV 91.5  78.0 - 100.0 (fL)    MCH 31.8  26.0 - 34.0 (pg)    MCHC 34.8  30.0 - 36.0 (g/dL)    RDW 09.8  11.9 - 14.7 (%)    Platelets 311  150 - 400 (K/uL)   COMPREHENSIVE METABOLIC PANEL     Status: Abnormal   Collection Time   01/24/11  4:45 AM      Component Value Range Comment   Sodium 136  135 - 145 (mEq/L)    Potassium 3.2 (*) 3.5 - 5.1 (mEq/L)    Chloride 106  96 - 112 (mEq/L)    CO2 23  19 - 32 (mEq/L)    Glucose, Bld 83  70 - 99 (mg/dL)    BUN 7  6 - 23 (mg/dL)    Creatinine, Ser 8.29  0.50 - 1.10 (mg/dL)    Calcium 7.9 (*) 8.4 - 10.5 (mg/dL)    Total Protein 5.6 (*) 6.0 - 8.3 (g/dL)    Albumin 2.2 (*) 3.5 - 5.2 (g/dL)    AST 21  0 - 37 (U/L)    ALT 30  0 - 35 (U/L)    Alkaline Phosphatase 137 (*) 39 - 117 (U/L)    Total Bilirubin 0.2 (*) 0.3 - 1.2 (mg/dL)    GFR calc non Af Amer >90  >90 (mL/min)    GFR calc Af Amer >90  >90 (mL/min)   LACTATE DEHYDROGENASE     Status: Normal   Collection Time   01/24/11  4:45 AM      Component Value Range Comment   LD 181  94 - 250 (U/L)   URIC ACID     Status: Normal   Collection Time   01/24/11  4:45 AM      Component Value Range Comment   Uric Acid, Serum 2.5  2.4 - 7.0 (mg/dL)   CBC     Status: Abnormal   Collection Time   01/25/11  5:08 AM      Component Value Range Comment   WBC 11.4 (*) 4.0 - 10.5 (K/uL)    RBC 3.57 (*) 3.87 - 5.11 (MIL/uL)    Hemoglobin 11.3 (*) 12.0 - 15.0 (g/dL)    HCT 56.2 (*) 13.0 - 46.0 (%)    MCV 91.0  78.0 - 100.0  (fL)    MCH 31.7  26.0 - 34.0 (pg)    MCHC 34.8  30.0 - 36.0 (g/dL)    RDW 86.5  78.4 - 69.6 (%)    Platelets 314  150 - 400 (K/uL)   COMPREHENSIVE METABOLIC PANEL     Status: Abnormal   Collection Time   01/25/11  5:08 AM      Component Value Range Comment   Sodium 135  135 - 145 (mEq/L)    Potassium 3.8  3.5 - 5.1 (mEq/L)    Chloride 104  96 - 112 (mEq/L)    CO2 23  19 - 32 (mEq/L)    Glucose, Bld 79  70 - 99 (mg/dL)    BUN 7  6 - 23 (mg/dL)    Creatinine, Ser 2.95  0.50 - 1.10 (mg/dL)    Calcium 8.2 (*) 8.4 - 10.5 (mg/dL)    Total Protein 5.3 (*) 6.0 - 8.3 (g/dL)  Albumin 2.3 (*) 3.5 - 5.2 (g/dL)    AST 20  0 - 37 (U/L)    ALT 26  0 - 35 (U/L)    Alkaline Phosphatase 134 (*) 39 - 117 (U/L)    Total Bilirubin 0.3  0.3 - 1.2 (mg/dL)    GFR calc non Af Amer >90  >90 (mL/min)    GFR calc Af Amer >90  >90 (mL/min)   LACTATE DEHYDROGENASE     Status: Normal   Collection Time   01/25/11  5:08 AM      Component Value Range Comment   LD 172  94 - 250 (U/L)   URIC ACID     Status: Normal   Collection Time   01/25/11  5:08 AM      Component Value Range Comment   Uric Acid, Serum 2.5  2.4 - 7.0 (mg/dL)     Assessment and Plan [redacted]w[redacted]d preeclampsia  Pt stable no PIH symptoms Plan induction at 37 weeks

## 2011-01-26 LAB — COMPREHENSIVE METABOLIC PANEL
ALT: 30 U/L (ref 0–35)
AST: 22 U/L (ref 0–37)
Calcium: 8.9 mg/dL (ref 8.4–10.5)
GFR calc Af Amer: >90 mL/min (ref >90)
Glucose, Bld: 98 mg/dL (ref 70–99)
Sodium: 135 mEq/L (ref 135–145)
Total Protein: 5.9 g/dL — ABNORMAL LOW (ref 6.0–8.3)

## 2011-01-26 LAB — CBC
MCH: 31.8 pg (ref 26.0–34.0)
MCHC: 35 g/dL (ref 30.0–36.0)
Platelets: 318 10*3/uL (ref 150–400)

## 2011-01-26 NOTE — Progress Notes (Signed)
Patient ID: Heather Daugherty, female   DOB: 04/05/1985, 26 y.o.   MRN: [id number] Pt without complaints.  No leakage of fluid or VB.  Good FM.  No HA, RUQ pain or visual changes  BP 121/71  Pulse 102  Temp(Src) 98.1 F (36.7 C) (Oral)  Resp 18  Ht 4\' 11"  (1.499 m)  Wt 64.32 kg (141 lb 12.8 oz)  BMI 28.64 kg/m2  FHTS NSt reactive on all three  Toco occ  Pt in NAD CV RRR Lungs CTAB abd  Gravid soft and NT GU no vb EXt no calf tenderness Results for orders placed during the hospital encounter of 01/22/11 (from the past 72 hour(s))  CBC     Status: Abnormal   Collection Time   01/24/11  4:45 AM      Component Value Range Comment   WBC 10.0  4.0 - 10.5 (K/uL)    RBC 3.52 (*) 3.87 - 5.11 (MIL/uL)    Hemoglobin 11.2 (*) 12.0 - 15.0 (g/dL)    HCT 46.9 (*) 62.9 - 46.0 (%)    MCV 91.5  78.0 - 100.0 (fL)    MCH 31.8  26.0 - 34.0 (pg)    MCHC 34.8  30.0 - 36.0 (g/dL)    RDW 52.8  41.3 - 24.4 (%)    Platelets 311  150 - 400 (K/uL)   COMPREHENSIVE METABOLIC PANEL     Status: Abnormal   Collection Time   01/24/11  4:45 AM      Component Value Range Comment   Sodium 136  135 - 145 (mEq/L)    Potassium 3.2 (*) 3.5 - 5.1 (mEq/L)    Chloride 106  96 - 112 (mEq/L)    CO2 23  19 - 32 (mEq/L)    Glucose, Bld 83  70 - 99 (mg/dL)    BUN 7  6 - 23 (mg/dL)    Creatinine, Ser 0.10  0.50 - 1.10 (mg/dL)    Calcium 7.9 (*) 8.4 - 10.5 (mg/dL)    Total Protein 5.6 (*) 6.0 - 8.3 (g/dL)    Albumin 2.2 (*) 3.5 - 5.2 (g/dL)    AST 21  0 - 37 (U/L)    ALT 30  0 - 35 (U/L)    Alkaline Phosphatase 137 (*) 39 - 117 (U/L)    Total Bilirubin 0.2 (*) 0.3 - 1.2 (mg/dL)    GFR calc non Af Amer >90  >90 (mL/min)    GFR calc Af Amer >90  >90 (mL/min)   LACTATE DEHYDROGENASE     Status: Normal   Collection Time   01/24/11  4:45 AM      Component Value Range Comment   LD 181  94 - 250 (U/L)   URIC ACID     Status: Normal   Collection Time   01/24/11  4:45 AM      Component Value Range Comment   Uric Acid,  Serum 2.5  2.4 - 7.0 (mg/dL)   CBC     Status: Abnormal   Collection Time   01/25/11  5:08 AM      Component Value Range Comment   WBC 11.4 (*) 4.0 - 10.5 (K/uL)    RBC 3.57 (*) 3.87 - 5.11 (MIL/uL)    Hemoglobin 11.3 (*) 12.0 - 15.0 (g/dL)    HCT 27.2 (*) 53.6 - 46.0 (%)    MCV 91.0  78.0 - 100.0 (fL)    MCH 31.7  26.0 - 34.0 (pg)    MCHC  34.8  30.0 - 36.0 (g/dL)    RDW 96.0  45.4 - 09.8 (%)    Platelets 314  150 - 400 (K/uL)   COMPREHENSIVE METABOLIC PANEL     Status: Abnormal   Collection Time   01/25/11  5:08 AM      Component Value Range Comment   Sodium 135  135 - 145 (mEq/L)    Potassium 3.8  3.5 - 5.1 (mEq/L)    Chloride 104  96 - 112 (mEq/L)    CO2 23  19 - 32 (mEq/L)    Glucose, Bld 79  70 - 99 (mg/dL)    BUN 7  6 - 23 (mg/dL)    Creatinine, Ser 1.19  0.50 - 1.10 (mg/dL)    Calcium 8.2 (*) 8.4 - 10.5 (mg/dL)    Total Protein 5.3 (*) 6.0 - 8.3 (g/dL)    Albumin 2.3 (*) 3.5 - 5.2 (g/dL)    AST 20  0 - 37 (U/L)    ALT 26  0 - 35 (U/L)    Alkaline Phosphatase 134 (*) 39 - 117 (U/L)    Total Bilirubin 0.3  0.3 - 1.2 (mg/dL)    GFR calc non Af Amer >90  >90 (mL/min)    GFR calc Af Amer >90  >90 (mL/min)   LACTATE DEHYDROGENASE     Status: Normal   Collection Time   01/25/11  5:08 AM      Component Value Range Comment   LD 172  94 - 250 (U/L)   URIC ACID     Status: Normal   Collection Time   01/25/11  5:08 AM      Component Value Range Comment   Uric Acid, Serum 2.5  2.4 - 7.0 (mg/dL)   CBC     Status: Abnormal   Collection Time   01/26/11  5:42 AM      Component Value Range Comment   WBC 12.0 (*) 4.0 - 10.5 (K/uL)    RBC 3.71 (*) 3.87 - 5.11 (MIL/uL)    Hemoglobin 11.8 (*) 12.0 - 15.0 (g/dL)    HCT 14.7 (*) 82.9 - 46.0 (%)    MCV 90.8  78.0 - 100.0 (fL)    MCH 31.8  26.0 - 34.0 (pg)    MCHC 35.0  30.0 - 36.0 (g/dL)    RDW 56.2  13.0 - 86.5 (%)    Platelets 318  150 - 400 (K/uL)   COMPREHENSIVE METABOLIC PANEL     Status: Abnormal   Collection Time   01/26/11   5:42 AM      Component Value Range Comment   Sodium 135  135 - 145 (mEq/L)    Potassium 3.5  3.5 - 5.1 (mEq/L)    Chloride 103  96 - 112 (mEq/L)    CO2 22  19 - 32 (mEq/L)    Glucose, Bld 98  70 - 99 (mg/dL)    BUN 8  6 - 23 (mg/dL)    Creatinine, Ser 7.84  0.50 - 1.10 (mg/dL)    Calcium 8.9  8.4 - 10.5 (mg/dL)    Total Protein 5.9 (*) 6.0 - 8.3 (g/dL)    Albumin 2.4 (*) 3.5 - 5.2 (g/dL)    AST 22  0 - 37 (U/L)    ALT 30  0 - 35 (U/L)    Alkaline Phosphatase 138 (*) 39 - 117 (U/L)    Total Bilirubin 0.2 (*) 0.3 - 1.2 (mg/dL)    GFR calc non Af  Amer >90  >90 (mL/min)    GFR calc Af Amer >90  >90 (mL/min)   LACTATE DEHYDROGENASE     Status: Normal   Collection Time   01/26/11  5:42 AM      Component Value Range Comment   LD 153  94 - 250 (U/L)   URIC ACID     Status: Normal   Collection Time   01/26/11  5:42 AM      Component Value Range Comment   Uric Acid, Serum 2.9  2.4 - 7.0 (mg/dL)     Assessment and Plan [redacted]w[redacted]d  Preeclampsia.  Pt doing well.  Continue current care and plan for induction at 37 weeks

## 2011-01-27 LAB — CBC
MCV: 91.2 fL (ref 78.0–100.0)
Platelets: 314 10*3/uL (ref 150–400)
RBC: 3.73 MIL/uL — ABNORMAL LOW (ref 3.87–5.11)
WBC: 12.6 10*3/uL — ABNORMAL HIGH (ref 4.0–10.5)

## 2011-01-27 LAB — COMPREHENSIVE METABOLIC PANEL
ALT: 36 U/L — ABNORMAL HIGH (ref 0–35)
AST: 27 U/L (ref 0–37)
Alkaline Phosphatase: 136 U/L — ABNORMAL HIGH (ref 39–117)
CO2: 24 mEq/L (ref 19–32)
Chloride: 104 mEq/L (ref 96–112)
Creatinine, Ser: 0.6 mg/dL (ref 0.50–1.10)
GFR calc non Af Amer: >90 mL/min (ref >90)
Potassium: 3.8 mEq/L (ref 3.5–5.1)
Total Bilirubin: 0.2 mg/dL — ABNORMAL LOW (ref 0.3–1.2)

## 2011-01-27 LAB — URIC ACID: Uric Acid, Serum: 2.7 mg/dL (ref 2.4–7.0)

## 2011-01-27 NOTE — Progress Notes (Addendum)
Patient ID: Heather Daugherty, female   DOB: Apr 15, 1985, 26 y.o.   MRN: [id number]  Hospital day # 5 pregnancy at [redacted]w[redacted]d  S: well, reports good fetal activity      Contractions:none, irregular, every 3-6 minutes, feels them occasionally, not painful      Vaginal bleeding:none now       Vaginal discharge: denies  Denies any other c/o, no HA/N//V/RUQ pain or visual changes States she feels ready to deliver but also anxious  O: BP 148/102  Pulse 95  Temp(Src) 98.7 F (37.1 C) (Oral)  Resp 16  Ht 4\' 11"  (1.499 m)  Wt 66.679 kg (147 lb)  BMI 29.69 kg/m2      Fetal tracings:reviewed and reassuring FHR 130, mod var, + accels, no decels, Cat 1      Gen: alert, no distress Lungs: CTAB Heart: RRR Abd: soft, NT, gravid, BS x4 Ext: WNL  Pelvic: deferred, will check cx tmrw night  Results for orders placed during the hospital encounter of 01/22/11 (from the past 24 hour(s))  CBC     Status: Abnormal   Collection Time   01/27/11  6:02 AM      Component Value Range   WBC 12.6 (*) 4.0 - 10.5 (K/uL)   RBC 3.73 (*) 3.87 - 5.11 (MIL/uL)   Hemoglobin 11.8 (*) 12.0 - 15.0 (g/dL)   HCT 40.9 (*) 81.1 - 46.0 (%)   MCV 91.2  78.0 - 100.0 (fL)   MCH 31.6  26.0 - 34.0 (pg)   MCHC 34.7  30.0 - 36.0 (g/dL)   RDW 91.4  78.2 - 95.6 (%)   Platelets 314  150 - 400 (K/uL)  COMPREHENSIVE METABOLIC PANEL     Status: Abnormal   Collection Time   01/27/11  6:02 AM      Component Value Range   Sodium 135  135 - 145 (mEq/L)   Potassium 3.8  3.5 - 5.1 (mEq/L)   Chloride 104  96 - 112 (mEq/L)   CO2 24  19 - 32 (mEq/L)   Glucose, Bld 97  70 - 99 (mg/dL)   BUN 10  6 - 23 (mg/dL)   Creatinine, Ser 2.13  0.50 - 1.10 (mg/dL)   Calcium 8.8  8.4 - 08.6 (mg/dL)   Total Protein 5.8 (*) 6.0 - 8.3 (g/dL)   Albumin 2.3 (*) 3.5 - 5.2 (g/dL)   AST 27  0 - 37 (U/L)   ALT 36 (*) 0 - 35 (U/L)   Alkaline Phosphatase 136 (*) 39 - 117 (U/L)   Total Bilirubin 0.2 (*) 0.3 - 1.2 (mg/dL)   GFR calc non Af Amer >90  >90 (mL/min)   GFR calc Af Amer >90  >90 (mL/min)  LACTATE DEHYDROGENASE     Status: Normal   Collection Time   01/27/11  6:02 AM      Component Value Range   LD 152  94 - 250 (U/L)  URIC ACID     Status: Normal   Collection Time   01/27/11  6:02 AM      Component Value Range   Uric Acid, Serum 2.7  2.4 - 7.0 (mg/dL)   Labs: stable - ALT with slight elevation from 30-36   A: [redacted]w[redacted]d with PIH - stable     Plan IOL with cervical ripening Monday night Rv'd plan with pt   P: continue current plan of care MD to follow  Malissa Hippo  MD 01/27/2011 11:00 AM   Pt seen an examined  agree with above.

## 2011-01-27 NOTE — Progress Notes (Signed)
Monitors placed on pt for NST.

## 2011-01-28 ENCOUNTER — Encounter (HOSPITAL_COMMUNITY): Payer: Self-pay

## 2011-01-28 LAB — COMPREHENSIVE METABOLIC PANEL
Albumin: 2.5 g/dL — ABNORMAL LOW (ref 3.5–5.2)
BUN: 10 mg/dL (ref 6–23)
Creatinine, Ser: 0.64 mg/dL (ref 0.50–1.10)
GFR calc Af Amer: >90 mL/min (ref >90)
Glucose, Bld: 94 mg/dL (ref 70–99)
Total Protein: 6 g/dL (ref 6.0–8.3)

## 2011-01-28 LAB — CBC
HCT: 34.1 % — ABNORMAL LOW (ref 36.0–46.0)
MCH: 32 pg (ref 26.0–34.0)
MCHC: 35.2 g/dL (ref 30.0–36.0)
MCV: 90.9 fL (ref 78.0–100.0)
RDW: 12.4 % (ref 11.5–15.5)

## 2011-01-28 LAB — URIC ACID: Uric Acid, Serum: 3.1 mg/dL (ref 2.4–7.0)

## 2011-01-28 LAB — LACTATE DEHYDROGENASE: LDH: 169 U/L (ref 94–250)

## 2011-01-28 MED ORDER — LABETALOL HCL 5 MG/ML IV SOLN
10.0000 mg | INTRAVENOUS | Status: DC | PRN
  Administered 2011-01-30: 10 mg via INTRAVENOUS
  Filled 2011-01-28: qty 4

## 2011-01-28 MED ORDER — PHENYLEPHRINE 40 MCG/ML (10ML) SYRINGE FOR IV PUSH (FOR BLOOD PRESSURE SUPPORT): 80.0000 ug | PREFILLED_SYRINGE | INTRAVENOUS | Status: DC | PRN

## 2011-01-28 MED ORDER — TERBUTALINE SULFATE 1 MG/ML IJ SOLN: 0.2500 mg | Freq: Once | INTRAMUSCULAR | Status: AC | PRN

## 2011-01-28 MED ORDER — IBUPROFEN 600 MG PO TABS: 600.0000 mg | ORAL_TABLET | Freq: Four times a day (QID) | ORAL | Status: DC | PRN

## 2011-01-28 MED ORDER — ONDANSETRON HCL 4 MG/2ML IJ SOLN
4.0000 mg | Freq: Four times a day (QID) | INTRAMUSCULAR | Status: DC | PRN
  Filled 2011-01-28: qty 2

## 2011-01-28 MED ORDER — FENTANYL 2.5 MCG/ML BUPIVACAINE 1/10 % EPIDURAL INFUSION (WH - ANES)
14.0000 mL/h | INTRAMUSCULAR | Status: DC
  Administered 2011-01-29 – 2011-01-30 (×7): 14 mL/h via EPIDURAL
  Filled 2011-01-28 (×8): qty 60

## 2011-01-28 MED ORDER — OXYTOCIN 20 UNITS IN LACTATED RINGERS INFUSION - SIMPLE
125.0000 mL/h | Freq: Once | INTRAVENOUS | Status: AC
  Administered 2011-01-30: 999 mL/h via INTRAVENOUS

## 2011-01-28 MED ORDER — HYDROXYZINE HCL 50 MG PO TABS
50.0000 mg | ORAL_TABLET | Freq: Four times a day (QID) | ORAL | Status: DC | PRN
  Administered 2011-01-29: 50 mg via ORAL
  Filled 2011-01-28: qty 1

## 2011-01-28 MED ORDER — LACTATED RINGERS IV SOLN
500.0000 mL | INTRAVENOUS | Status: DC | PRN
  Administered 2011-01-29: 500 mL via INTRAVENOUS
  Administered 2011-01-30: 100 mL via INTRAVENOUS

## 2011-01-28 MED ORDER — SODIUM CHLORIDE 0.9 % IV SOLN: 250.0000 mL | INTRAVENOUS | Status: DC | PRN

## 2011-01-28 MED ORDER — LACTATED RINGERS IV SOLN
500.0000 mL | Freq: Once | INTRAVENOUS | Status: AC
  Administered 2011-01-29: 500 mL via INTRAVENOUS

## 2011-01-28 MED ORDER — LIDOCAINE HCL (PF) 1 % IJ SOLN: 30.0000 mL | INTRAMUSCULAR | Status: DC | PRN

## 2011-01-28 MED ORDER — CITRIC ACID-SODIUM CITRATE 334-500 MG/5ML PO SOLN: 30.0000 mL | ORAL | Status: DC | PRN

## 2011-01-28 MED ORDER — ACETAMINOPHEN 325 MG PO TABS: 650.0000 mg | ORAL_TABLET | ORAL | Status: DC | PRN

## 2011-01-28 MED ORDER — EPHEDRINE 5 MG/ML INJ
10.0000 mg | INTRAVENOUS | Status: DC | PRN
  Filled 2011-01-28 (×2): qty 4

## 2011-01-28 MED ORDER — PHENYLEPHRINE 40 MCG/ML (10ML) SYRINGE FOR IV PUSH (FOR BLOOD PRESSURE SUPPORT)
80.0000 ug | PREFILLED_SYRINGE | INTRAVENOUS | Status: DC | PRN
  Filled 2011-01-28 (×2): qty 5

## 2011-01-28 MED ORDER — SODIUM CHLORIDE 0.9 % IJ SOLN: 3.0000 mL | INTRAMUSCULAR | Status: DC | PRN

## 2011-01-28 MED ORDER — MISOPROSTOL 25 MCG QUARTER TABLET
25.0000 ug | ORAL_TABLET | ORAL | Status: DC | PRN
  Administered 2011-01-28: 25 ug via VAGINAL
  Filled 2011-01-28 (×2): qty 0.25

## 2011-01-28 MED ORDER — EPHEDRINE 5 MG/ML INJ: 10.0000 mg | INTRAVENOUS | Status: DC | PRN

## 2011-01-28 MED ORDER — OXYCODONE-ACETAMINOPHEN 5-325 MG PO TABS: 2.0000 | ORAL_TABLET | ORAL | Status: DC | PRN

## 2011-01-28 MED ORDER — SODIUM CHLORIDE 0.9 % IJ SOLN: 3.0000 mL | Freq: Two times a day (BID) | INTRAMUSCULAR | Status: DC

## 2011-01-28 MED ORDER — OXYTOCIN BOLUS FROM INFUSION
500.0000 mL | Freq: Once | INTRAVENOUS | Status: DC
  Filled 2011-01-28: qty 500

## 2011-01-28 MED ORDER — BUTORPHANOL TARTRATE 2 MG/ML IJ SOLN
1.0000 mg | INTRAMUSCULAR | Status: DC | PRN
  Administered 2011-01-29 (×3): 1 mg via INTRAVENOUS
  Filled 2011-01-28 (×3): qty 1

## 2011-01-28 MED ORDER — DIPHENHYDRAMINE HCL 50 MG/ML IJ SOLN: 12.5000 mg | INTRAMUSCULAR | Status: DC | PRN

## 2011-01-28 MED ORDER — HYDROXYZINE HCL 50 MG/ML IM SOLN: 50.0000 mg | Freq: Four times a day (QID) | INTRAMUSCULAR | Status: DC | PRN

## 2011-01-28 MED ORDER — FLEET ENEMA 7-19 GM/118ML RE ENEM: 1.0000 | ENEMA | RECTAL | Status: DC | PRN

## 2011-01-28 MED ORDER — OXYTOCIN 20 UNITS IN LACTATED RINGERS INFUSION - SIMPLE
1.0000 m[IU]/min | INTRAVENOUS | Status: DC
  Administered 2011-01-29: 2 m[IU]/min via INTRAVENOUS
  Filled 2011-01-28: qty 1000

## 2011-01-28 NOTE — Progress Notes (Signed)
Hospital day # 6 pregnancy at [redacted]w[redacted]d  S: well, reports good fetal activity      Contractions:none, irregular      Vaginal bleeding:none now       Vaginal discharge: no significant change      Denies symptoms of pre-eclampsia  O: BP 129/81  Pulse 101  Temp(Src) 98.4 F (36.9 C) (Oral)  Resp 18  Ht 4\' 11"  (1.499 m)  Wt 70.489 kg (155 lb 6.4 oz)  BMI 31.39 kg/m2      Fetal tracings:reviewed and reassuring      Uterus gravid and non-tender      Extremities: no significant edema and no signs of DVT     DTR 2-3/4 no clonus      VE: 1/50/post/-1/vertex  PIH labs: all normal  Uric acid 3.1  A: [redacted]w[redacted]d with mild pre-eclampsia     Stable     Will transfer to L&D tonight for cervical ripening     Reviewed IOL and Magnesium Sulfate. Questions answered. Pt voices understanding.  P:  W  continue current plan of care  Adeola Dennen A  MD 01/28/2011 3:18 PM

## 2011-01-29 ENCOUNTER — Inpatient Hospital Stay (HOSPITAL_COMMUNITY): Admit: 2011-01-29 | Payer: Medicaid Other

## 2011-01-29 ENCOUNTER — Encounter (HOSPITAL_COMMUNITY): Payer: Self-pay | Admitting: Anesthesiology

## 2011-01-29 ENCOUNTER — Inpatient Hospital Stay (HOSPITAL_COMMUNITY): Payer: Medicaid Other | Admitting: Anesthesiology

## 2011-01-29 LAB — COMPREHENSIVE METABOLIC PANEL
AST: 31 U/L (ref 0–37)
BUN: 9 mg/dL (ref 6–23)
CO2: 24 mEq/L (ref 19–32)
Chloride: 104 mEq/L (ref 96–112)
Creatinine, Ser: 0.65 mg/dL (ref 0.50–1.10)
GFR calc Af Amer: >90 mL/min (ref >90)
GFR calc non Af Amer: >90 mL/min (ref >90)
Glucose, Bld: 112 mg/dL — ABNORMAL HIGH (ref 70–99)
Total Bilirubin: 0.3 mg/dL (ref 0.3–1.2)

## 2011-01-29 LAB — CBC
HCT: 34.4 % — ABNORMAL LOW (ref 36.0–46.0)
Hemoglobin: 12.2 g/dL (ref 12.0–15.0)
MCV: 91.5 fL (ref 78.0–100.0)
RBC: 3.76 MIL/uL — ABNORMAL LOW (ref 3.87–5.11)
WBC: 13.2 10*3/uL — ABNORMAL HIGH (ref 4.0–10.5)

## 2011-01-29 LAB — URIC ACID: Uric Acid, Serum: 3 mg/dL (ref 2.4–7.0)

## 2011-01-29 LAB — RPR: RPR Ser Ql: NONREACTIVE

## 2011-01-29 MED ORDER — MAGNESIUM SULFATE 40 G IN LACTATED RINGERS - SIMPLE
2.0000 g/h | INTRAVENOUS | Status: DC
  Administered 2011-01-30 – 2011-01-31 (×2): 2 g/h via INTRAVENOUS
  Filled 2011-01-29 (×3): qty 500

## 2011-01-29 MED ORDER — SODIUM BICARBONATE 8.4 % IV SOLN
INTRAVENOUS | Status: DC | PRN
  Administered 2011-01-29: 3 mL via EPIDURAL

## 2011-01-29 MED ORDER — FENTANYL 2.5 MCG/ML BUPIVACAINE 1/10 % EPIDURAL INFUSION (WH - ANES)
INTRAMUSCULAR | Status: DC | PRN
  Administered 2011-01-29: 12 mL/h via EPIDURAL

## 2011-01-29 MED ORDER — BUPIVACAINE HCL (PF) 0.25 % IJ SOLN
INTRAMUSCULAR | Status: DC | PRN
  Administered 2011-01-29: 10 mL via EPIDURAL

## 2011-01-29 MED ORDER — DINOPROSTONE 10 MG VA INST
10.0000 mg | VAGINAL_INSERT | Freq: Once | VAGINAL | Status: AC
  Administered 2011-01-29: 10 mg via VAGINAL
  Filled 2011-01-29: qty 1

## 2011-01-29 MED ORDER — MAGNESIUM SULFATE BOLUS VIA INFUSION
4.0000 g | Freq: Once | INTRAVENOUS | Status: DC
  Administered 2011-01-29: 4 g via INTRAVENOUS
  Filled 2011-01-29: qty 500

## 2011-01-29 MED ORDER — LACTATED RINGERS IV SOLN
INTRAVENOUS | Status: DC
  Administered 2011-01-29: 125 mL/h via INTRAVENOUS
  Administered 2011-01-29: 79 mL/h via INTRAVENOUS
  Administered 2011-01-30: 100 mL via INTRAVENOUS
  Administered 2011-01-30: 05:00:00 via INTRAVENOUS

## 2011-01-29 MED ORDER — FENTANYL CITRATE 0.05 MG/ML IJ SOLN: 100.0000 ug | INTRAMUSCULAR | Status: DC | PRN

## 2011-01-29 MED ORDER — PROMETHAZINE HCL 25 MG/ML IJ SOLN
12.5000 mg | INTRAMUSCULAR | Status: DC | PRN
  Administered 2011-01-29: 12.5 mg via INTRAVENOUS
  Filled 2011-01-29: qty 1

## 2011-01-29 NOTE — Progress Notes (Signed)
Subjective: Pt w/ increasing pain again w/ ctxs over last hr.  She did receive IV Phenergan about 1 hr ago, secondary to cont'd n/v, mainly when pt changes position.  PCA epid has been pushed 2-3 times since change of shift, and just hit again.  Will call anesthesia if no improvement after this dose.    Objective: BP 147/88  Pulse 105  Temp(Src) 98.8 F (37.1 C) (Oral)  Resp 20  Ht 4\' 11"  (1.499 m)  Wt 67.132 kg (148 lb)  BMI 29.89 kg/m2  SpO2 95% I/O last 3 completed shifts: In: 3144.5 [P.O.:1740; I.V.:1404.5] Out: 2525 [Urine:2425; Emesis/NG output:100] Total I/O In: 616.8 [P.O.:240; I.V.:376.8] Out: 500 [Urine:350; Emesis/NG output:150] .Marland Kitchen Filed Vitals:   01/29/11 2052 01/29/11 2101 01/29/11 2131 01/29/11 2201  BP:  155/94 171/99 147/88  Pulse:  112 110 105  Temp:    98.8 F (37.1 C)  TempSrc:    Oral  Resp: 20 20 20 20   Height:      Weight:      SpO2:       FHT:  FHR: 130 bpm, variability: moderate,  accelerations:  Present,  decelerations:  Present some earlies between 2100 and 2130 UC:   regular, every 2.5-3.5 minutes  MVU's 150-220 SVE:   Dilation: 7 Effacement (%): 90 Station: -1 Exam by:: H. Malka Bocek, CNM Head still needs to rotate  Assessment / Plan: Induction of labor due to preeclampsia,  progressing well on pitocin  Labor: Progressing normally Preeclampsia:  Mild PreEclampsia--on magnesium since this morning Fetal Wellbeing:  Category I Pain Control:  Epidural--not keeping up w/ pt's pain--may need to redose I/D:  n/a Anticipated MOD:  NSVD RN is increasing Pitocin 1mu every hr.  Placed pt in Lt exaggerated Sims. Episode of vomiting again once on her side. Zofran prn.  Will CTO closely. BP are Labile at times--Labetalol ordered prn.  C/w MD prn. Akshat Minehart H 01/29/2011, 10:28 PM

## 2011-01-29 NOTE — Progress Notes (Signed)
Subjective: Pt currently resting, but RN called just after 0300 and pt was uncomfortable w/ some ctxs and unable to sleep, despite Ambien earlier in night.  Pt given 50mg  po Vistaril around 0334.    Objective: BP 139/84  Pulse 97  Temp(Src) 98.4 F (36.9 C) (Oral)  Resp 20  Ht 4\' 11"  (1.499 m)  Wt 70.489 kg (155 lb 6.4 oz)  BMI 31.39 kg/m2   Total I/O In: 920 [P.O.:920] Out: 375 [Urine:375]  FHT:  FHR: 130 bpm, variability: moderate,  accelerations:  Present,  decelerations:  Absent UC:   regular, every 2-4 minutes SVE:   Dilation: 1 Effacement (%): 50 Station: -2;-1 Exam by:: Heather Daugherty, CNM--not rechecked since Cervidil placed at 0015  Assessment / Plan: 1.  IUP at 37 weeks  2.  Mild PreEclampsia  3.  GBS neg  4.  Prostaglandin induction w/ good ctx pattern   Labor: Induction in progress Preeclampsia:  labs stable and mild PreE Fetal Wellbeing:  Category I Pain Control:  Vistaril po x1 at 0334 I/D:  n/a Anticipated MOD:  NSVD Will CTO closely, and will have oncoming CNM on dayshift check pt this morning to see if cervidil should remain in full 12 hrs vs d/c'ng and starting Pitocin.  Support prn.  Per Dr. Estanislado Pandy, will start Magnesium in active labor or when Pitocin started.  C/w MD prn. Braydn Carneiro H 01/29/2011, 5:08 AM

## 2011-01-29 NOTE — Anesthesia Preprocedure Evaluation (Signed)
Anesthesia Evaluation  Patient identified by MRN, date of birth, ID band Patient awake    Reviewed: Allergy & Precautions, H&P , Patient's Chart, lab work & pertinent test results  Airway Mallampati: II TM Distance: >3 FB Neck ROM: full    Dental  (+) Teeth Intact   Pulmonary  clear to auscultation        Cardiovascular hypertension (PIH, on Mg+2, DBP controlled), On Medications regular Normal    Neuro/Psych    GI/Hepatic   Endo/Other    Renal/GU      Musculoskeletal   Abdominal   Peds  Hematology   Anesthesia Other Findings       Reproductive/Obstetrics (+) Pregnancy                           Anesthesia Physical Anesthesia Plan  ASA: II  Anesthesia Plan: Epidural   Post-op Pain Management:    Induction:   Airway Management Planned:   Additional Equipment:   Intra-op Plan:   Post-operative Plan:   Informed Consent: I have reviewed the patients History and Physical, chart, labs and discussed the procedure including the risks, benefits and alternatives for the proposed anesthesia with the patient or authorized representative who has indicated his/her understanding and acceptance.   Dental Advisory Given  Plan Discussed with:   Anesthesia Plan Comments: (Labs checked- platelets confirmed with RN in room. Fetal heart tracing, per RN, reported to be stable enough for sitting procedure. Discussed epidural, and patient consents to the procedure:  included risk of possible headache,backache, failed block, allergic reaction, and nerve injury. This patient was asked if she had any questions or concerns before the procedure started. )        Anesthesia Quick Evaluation

## 2011-01-29 NOTE — Progress Notes (Signed)
Subjective: Received Stadol at 7am with benefit, requesting more pain medication.  Very anxious.  Denies HA, visual symptoms, epigastric pain.  Family at bedside.  Objective: BP 153/75  Pulse 91  Temp(Src) 97.9 F (36.6 C) (Oral)  Resp 16  Ht 4\' 11"  (1.499 m)  Wt 148 lb (67.132 kg)  BMI 29.89 kg/m2 I/O last 3 completed shifts: In: 1145 [P.O.:1020; I.V.:125] Out: 1025 [Urine:1025]   Filed Vitals:   01/29/11 0319 01/29/11 0542 01/29/11 0655 01/29/11 0802  BP: 139/84 148/77  153/75  Pulse: 97 84  91  Temp: 98.4 F (36.9 C)   97.9 F (36.6 C)  TempSrc: Oral   Oral  Resp: 20 22  16   Height:      Weight:   148 lb (67.132 kg)     FHT:  Category 1 UC:   regular, every 2-3 minutes  SVE:   Dilation: 1 Effacement (%): 70 Station: -2;-1 Exam by:: V Suzetta Timko CNM Cervidil removed, bloody show noted. Exam difficult due to patient discomfort DTR 1-2+ without clonus, 1+ edema  Labs:  Results for orders placed during the hospital encounter of 01/22/11 (from the past 24 hour(s))  CBC     Status: Abnormal   Collection Time   01/29/11  5:05 AM      Component Value Range   WBC 13.2 (*) 4.0 - 10.5 (K/uL)   RBC 3.76 (*) 3.87 - 5.11 (MIL/uL)   Hemoglobin 12.2  12.0 - 15.0 (g/dL)   HCT 41.3 (*) 24.4 - 46.0 (%)   MCV 91.5  78.0 - 100.0 (fL)   MCH 32.4  26.0 - 34.0 (pg)   MCHC 35.5  30.0 - 36.0 (g/dL)   RDW 01.0  27.2 - 53.6 (%)   Platelets 324  150 - 400 (K/uL)  COMPREHENSIVE METABOLIC PANEL     Status: Abnormal   Collection Time   01/29/11  5:05 AM      Component Value Range   Sodium 135  135 - 145 (mEq/L)   Potassium 4.3  3.5 - 5.1 (mEq/L)   Chloride 104  96 - 112 (mEq/L)   CO2 24  19 - 32 (mEq/L)   Glucose, Bld 112 (*) 70 - 99 (mg/dL)   BUN 9  6 - 23 (mg/dL)   Creatinine, Ser 6.44  0.50 - 1.10 (mg/dL)   Calcium 9.3  8.4 - 03.4 (mg/dL)   Total Protein 6.4  6.0 - 8.3 (g/dL)   Albumin 2.5 (*) 3.5 - 5.2 (g/dL)   AST 31  0 - 37 (U/L)   ALT 35  0 - 35 (U/L)   Alkaline  Phosphatase 155 (*) 39 - 117 (U/L)   Total Bilirubin 0.3  0.3 - 1.2 (mg/dL)   GFR calc non Af Amer >90  >90 (mL/min)   GFR calc Af Amer >90  >90 (mL/min)  LACTATE DEHYDROGENASE     Status: Abnormal   Collection Time   01/29/11  5:05 AM      Component Value Range   LD 291 (*) 94 - 250 (U/L)  URIC ACID     Status: Normal   Collection Time   01/29/11  5:05 AM      Component Value Range   Uric Acid, Serum 3.0  2.4 - 7.0 (mg/dL)    Assessment / Plan: IUP at 37 weeks Pre-eclampsia S/p cervical ripening  Plan: Reviewed plan of care with patient and family Recommend epidural, then will start pitocin. Initiate magnesium sulfate with start of pitocin, with  mag 4 gm bolus, then 2 gm/hour.    Nigel Bridgeman 01/29/2011, 9:40 AM

## 2011-01-29 NOTE — Progress Notes (Signed)
  Subjective: Doing well.  Some awareness of contractions on left side.  Denies HA, epigastric pain, or visual symptoms.  Objective: BP 140/91  Pulse 99  Temp(Src) 98.2 F (36.8 C) (Oral)  Resp 15  Ht 4\' 11"  (1.499 m)  Wt 148 lb (67.132 kg)  BMI 29.89 kg/m2  SpO2 95% I/O last 3 completed shifts: In: 1145 [P.O.:1020; I.V.:125] Out: 1025 [Urine:1025] Total I/O In: 1511.6 [P.O.:480; I.V.:1031.6] Out: 1100 [Urine:1100]  Filed Vitals:   01/29/11 1531 01/29/11 1601 01/29/11 1631 01/29/11 1701  BP: 152/110 155/99 145/90 140/91  Pulse: 103 100 98 99  Temp:    98.2 F (36.8 C)  TempSrc:    Oral  Resp:  20  15  Height:      Weight:      SpO2:         FHT:  Category 1 UC:   regular, every 2-3 minutes SVE:   Dilation: 3 Effacement (%): 80 Station: -2;-1 Exam by:: Manfred Arch CNM AROM--clear fluid.  IUPC placed. Pitocin on 7 mu/min  Assessment / Plan: Induction of labor due to preeclampsia,  progressing well on pitocin Mild increase in BP--single value diastolic 110.  Plan: Continue augmentation and labor observation Labetalol regimen for systolic > or = 160, diastolic > or = 105 Redose epidural as needed, position to facilitate descent  Nigel Bridgeman 01/29/2011, 5:32 PM

## 2011-01-29 NOTE — Progress Notes (Signed)
  Subjective: Comfortable with epidural.  Multiple family in to visit.  Denies HA, visual symptoms, epigastric pain.  Objective: BP 123/77  Pulse 89  Temp(Src) 98.1 F (36.7 C) (Oral)  Resp 16  Ht 4\' 11"  (1.499 m)  Wt 148 lb (67.132 kg)  BMI 29.89 kg/m2  SpO2 95% I/O last 3 completed shifts: In: 1145 [P.O.:1020; I.V.:125] Out: 1025 [Urine:1025] Total I/O In: 1019.5 [P.O.:240; I.V.:779.5] Out: 875 [Urine:875]  Filed Vitals:   01/29/11 1331 01/29/11 1401 01/29/11 1431 01/29/11 1501  BP: 148/94 145/97 141/101 123/77  Pulse: 83 87 87 89  Temp:      TempSrc:      Resp:  15  16  Height:      Weight:      SpO2:         FHT:  Category 1 UC:   regular, every 2-4 minutes SVE:   Dilation: 2.5 Effacement (%): 80 Station: -2;-1 Exam by:: V Tailor Westfall CNM Cervix very anterior, up under pubic symphysis, difficult to reach.  Vertex slightly better applied, but not yet appropriate for AROM  Pitocin at 6 mu/min.  Assessment / Plan: Induction of labor for pre-eclampsia--in early labor On Magnesium Sulfate Epidural in place--patient comfortable  Plan: Continue induction with pitocin Re-evaluate cervix in approx 2 hours, with AROM as appropriate  Shellby Schlink 01/29/2011, 3:29 PM

## 2011-01-29 NOTE — Progress Notes (Signed)
Subjective: Pt transferred to L&D and at shift change last evening and received cytotec pv around 2000.  Occ'l ctxs at that time.  BP's labile and some elevated, but if reposition and recheck in 5-10min, normalize w/o further intervention.  No LOF or VB.  GFM.  No PIH s/s tonight.  Had [place].  Feels ctxs currently, but are mild and tolerable.  Objective: BP 146/95  Pulse 111  Temp(Src) 98.9 F (37.2 C) (Oral)  Resp 20  Ht 4\' 11"  (1.499 m)  Wt 70.489 kg (155 lb 6.4 oz)  BMI 31.39 kg/m2  .Marland Kitchen Filed Vitals:   01/28/11 2215 01/28/11 2231 01/28/11 2301 01/29/11 0001  BP: 162/91 145/92 138/84 146/95  Pulse: 113 111 106 111  Temp:    98.9 F (37.2 C)  TempSrc:    Oral  Resp:  18 18 20   Height:      Weight:       Total I/O In: 680 [P.O.:680] Out: 175 [Urine:175]  FHT:  FHR: 140 bpm, variability: moderate,  accelerations:  Present,  decelerations:  Absent UC:   regular, every 2-4 minutes SVE:   Dilation: 1 Effacement (%): 50 Station: -2;-1 Exam by:: Heather Harrison, RN Cervidil placed; scant bloody show Assessment / Plan: 1.  IUP at 37 weeks  2.  Mild PreEclampsia  3.  Prostaglandin induction in progress  4.  Labile BP's--on Procardia XL 60mg  po qam Preeclampsia:  labs stable and mild PreEclampsia Fetal Wellbeing:  Category I Anticipated MOD:  NSVD  Secondary to frequency of ctxs, switched pt to cervidil after only 1 dose of pv cytotec.   Will CTO BP and ctxs closely.  Per c/w Dr. Estanislado Pandy, will begin Magnesium in active labor, or when pitocin started.  Heather Daugherty H 01/29/2011, 12:40 AM

## 2011-01-29 NOTE — Progress Notes (Signed)
Subjective: Pt w/ increasing pain and desires IV pain medicine.  Breathing w/ ctxs.  Reports vistaril didn't help much at all.  No LOF or VB.   Objective: BP 148/77  Pulse 84  Temp(Src) 98.4 F (36.9 C) (Oral)  Resp 22  Ht 4\' 11"  (1.499 m)  Wt 70.489 kg (155 lb 6.4 oz)  BMI 31.39 kg/m2   Total I/O In: 920 [P.O.:920] Out: 375 [Urine:375]  FHT:  FHR: 135 bpm, variability: moderate,  accelerations:  Present,  decelerations:  Absent UC:   regular, every 1-2 minutes SVE:   Dilation: 1 Effacement (%): 70 Station: -2;-1 Exam by:: H. Mileydi Milsap, CNM Cervidil removed for exam and then replaced Labs: .Marland Kitchen Results for orders placed during the hospital encounter of 01/22/11 (from the past 24 hour(s))  CBC     Status: Abnormal   Collection Time   01/29/11  5:05 AM      Component Value Range   WBC 13.2 (*) 4.0 - 10.5 (K/uL)   RBC 3.76 (*) 3.87 - 5.11 (MIL/uL)   Hemoglobin 12.2  12.0 - 15.0 (g/dL)   HCT 16.1 (*) 09.6 - 46.0 (%)   MCV 91.5  78.0 - 100.0 (fL)   MCH 32.4  26.0 - 34.0 (pg)   MCHC 35.5  30.0 - 36.0 (g/dL)   RDW 04.5  40.9 - 81.1 (%)   Platelets 324  150 - 400 (K/uL)  COMPREHENSIVE METABOLIC PANEL     Status: Abnormal   Collection Time   01/29/11  5:05 AM      Component Value Range   Sodium 135  135 - 145 (mEq/L)   Potassium 4.3  3.5 - 5.1 (mEq/L)   Chloride 104  96 - 112 (mEq/L)   CO2 24  19 - 32 (mEq/L)   Glucose, Bld 112 (*) 70 - 99 (mg/dL)   BUN 9  6 - 23 (mg/dL)   Creatinine, Ser 9.14  0.50 - 1.10 (mg/dL)   Calcium 9.3  8.4 - 78.2 (mg/dL)   Total Protein 6.4  6.0 - 8.3 (g/dL)   Albumin 2.5 (*) 3.5 - 5.2 (g/dL)   AST 31  0 - 37 (U/L)   ALT 35  0 - 35 (U/L)   Alkaline Phosphatase 155 (*) 39 - 117 (U/L)   Total Bilirubin 0.3  0.3 - 1.2 (mg/dL)   GFR calc non Af Amer >90  >90 (mL/min)   GFR calc Af Amer >90  >90 (mL/min)  LACTATE DEHYDROGENASE     Status: Abnormal   Collection Time   01/29/11  5:05 AM      Component Value Range   LD 291 (*) 94 - 250 (U/L)  URIC  ACID     Status: Normal   Collection Time   01/29/11  5:05 AM      Component Value Range   Uric Acid, Serum 3.0  2.4 - 7.0 (mg/dL)    Assessment / Plan: 1.  IUP at 37 weeks  2.  Induction in progress  3. Mild PreE  4.  desires pain meds  5.  GBS neg  Labor: early vs latent labor Preeclampsia:  LDH elevated this AM, but all other PIH labs WNL Fetal Wellbeing:  Category I Pain Control:  Stadol 1mg  IV x1 now I/D:  n/a Anticipated MOD:  NSVD Will CTO closely and c/w MD prn.  Enc'd pt to rest now if able to w/ Stadol.  Mandi Mattioli H 01/29/2011, 5:54 AM

## 2011-01-29 NOTE — Anesthesia Procedure Notes (Signed)

## 2011-01-29 NOTE — Progress Notes (Signed)
Subjective: Pt w/ recent nausea, but declines antiemetic at present.  Lft-sided pain, but improving now that repositioned to this side.    Objective: BP 147/90  Pulse 105  Temp(Src) 98.2 F (36.8 C) (Oral)  Resp 16  Ht 4\' 11"  (1.499 m)  Wt 67.132 kg (148 lb)  BMI 29.89 kg/m2  SpO2 95% I/O last 3 completed shifts: In: 3144.5 [P.O.:1740; I.V.:1404.5] Out: 2525 [Urine:2425; Emesis/NG output:100]    FHT:  FHR: 130 bpm, variability: moderate,  accelerations:  Present,  decelerations:  Absent UC:   regular, every 3 minutes SVE:   6/90/-2, stretchy, clear fluid,   Assessment / Plan: Induction of labor due to preeclampsia,  progressing well on pitocin  Labor: Progressing normally Preeclampsia:  Mild PreEclampsia Fetal Wellbeing:  Category I Pain Control:  Epidural I/D:  n/a Anticipated MOD:  NSVD MVU's 150-240, so will increase Pitocin 93mu/min every hr to continue to achieve consistent adequacy.  BP's overall stable, but elevated w/ pain; Labetalol IV prn.  Mag on 2gm/hr and pt w/o PIH s/s. C/w MD prn. Daviana Haymaker H 01/29/2011, 7:31 PM

## 2011-01-29 NOTE — Progress Notes (Signed)
  Subjective: Getting comfortable s/p epidural.    Objective: BP 129/79  Pulse 87  Temp(Src) 97.9 F (36.6 C) (Oral)  Resp 13  Ht 4\' 11"  (1.499 m)  Wt 148 lb (67.132 kg)  BMI 29.89 kg/m2  SpO2 95% I/O last 3 completed shifts: In: 1145 [P.O.:1020; I.V.:125] Out: 1025 [Urine:1025] Total I/O In: 0  Out: 275 [Urine:275]  Filed Vitals:   01/29/11 1115 01/29/11 1128 01/29/11 1145 01/29/11 1200  BP: 135/83 129/87 129/79 126/73  Pulse: 90 87  89  Temp:      TempSrc:      Resp:  15 13 13   Height:      Weight:      SpO2:         FHT:  Category 1, with two spontaneous decels with patient on back, with BP 130/80--recovery with position change UC:   regular, every 2-4 minutes SVE:   1 cm, 70%, vtx -2, not well-applied.  Assessment / Plan: Induction of labor due to pre-eclampsia Category 1 FHR  Start pitocin and magnesium, now that epidural has been placed.  Philip Eckersley 01/29/2011, 12:00 PM

## 2011-01-30 ENCOUNTER — Other Ambulatory Visit: Payer: Self-pay | Admitting: Obstetrics and Gynecology

## 2011-01-30 ENCOUNTER — Encounter (HOSPITAL_COMMUNITY): Payer: Self-pay | Admitting: *Deleted

## 2011-01-30 DIAGNOSIS — O1490 Unspecified pre-eclampsia, unspecified trimester: Secondary | ICD-10-CM | POA: Diagnosis present

## 2011-01-30 LAB — COMPREHENSIVE METABOLIC PANEL
ALT: 38 U/L — ABNORMAL HIGH (ref 0–35)
Alkaline Phosphatase: 165 U/L — ABNORMAL HIGH (ref 39–117)
BUN: 10 mg/dL (ref 6–23)
CO2: 20 mEq/L (ref 19–32)
Calcium: 8.1 mg/dL — ABNORMAL LOW (ref 8.4–10.5)
GFR calc Af Amer: >90 mL/min (ref >90)
GFR calc non Af Amer: >90 mL/min (ref >90)
Glucose, Bld: 122 mg/dL — ABNORMAL HIGH (ref 70–99)
Potassium: 4 mEq/L (ref 3.5–5.1)
Sodium: 137 mEq/L (ref 135–145)
Total Protein: 5.8 g/dL — ABNORMAL LOW (ref 6.0–8.3)

## 2011-01-30 LAB — CBC
HCT: 36.4 % (ref 36.0–46.0)
Hemoglobin: 12.7 g/dL (ref 12.0–15.0)
Hemoglobin: 11.7 g/dL — ABNORMAL LOW (ref 12.0–15.0)
MCH: 32.2 pg (ref 26.0–34.0)
MCH: 32.1 pg (ref 26.0–34.0)
MCHC: 34.9 g/dL (ref 30.0–36.0)
MCV: 92.6 fL (ref 78.0–100.0)
Platelets: 335 10*3/uL (ref 150–400)
RBC: 3.95 MIL/uL (ref 3.87–5.11)
RBC: 3.65 MIL/uL — ABNORMAL LOW (ref 3.87–5.11)
RBC: 3.12 MIL/uL — ABNORMAL LOW (ref 3.87–5.11)
RDW: 12.6 % (ref 11.5–15.5)
WBC: 29 10*3/uL — ABNORMAL HIGH (ref 4.0–10.5)
WBC: 27.9 10*3/uL — ABNORMAL HIGH (ref 4.0–10.5)

## 2011-01-30 LAB — MAGNESIUM: Magnesium: 5.1 mg/dL — ABNORMAL HIGH (ref 1.5–2.5)

## 2011-01-30 LAB — LACTATE DEHYDROGENASE: LDH: 214 U/L (ref 94–250)

## 2011-01-30 LAB — MRSA PCR SCREENING: MRSA by PCR: NEGATIVE

## 2011-01-30 MED ORDER — BENZOCAINE-MENTHOL 20-0.5 % EX AERO
1.0000 "application " | INHALATION_SPRAY | CUTANEOUS | Status: DC | PRN
  Administered 2011-01-30: 1 via TOPICAL
  Filled 2011-01-30: qty 56

## 2011-01-30 MED ORDER — SENNOSIDES-DOCUSATE SODIUM 8.6-50 MG PO TABS
2.0000 | ORAL_TABLET | Freq: Every day | ORAL | Status: DC
  Administered 2011-01-30 – 2011-01-31 (×2): 2 via ORAL

## 2011-01-30 MED ORDER — IBUPROFEN 600 MG PO TABS
600.0000 mg | ORAL_TABLET | Freq: Four times a day (QID) | ORAL | Status: DC
  Filled 2011-01-30 (×3): qty 1

## 2011-01-30 MED ORDER — TETANUS-DIPHTH-ACELL PERTUSSIS 5-2.5-18.5 LF-MCG/0.5 IM SUSP
0.5000 mL | Freq: Once | INTRAMUSCULAR | Status: AC
  Administered 2011-01-31: 0.5 mL via INTRAMUSCULAR
  Filled 2011-01-30: qty 0.5

## 2011-01-30 MED ORDER — DIBUCAINE 1 % RE OINT
1.0000 "application " | TOPICAL_OINTMENT | RECTAL | Status: DC | PRN
  Administered 2011-02-01: 1 via RECTAL
  Filled 2011-01-30 (×2): qty 28

## 2011-01-30 MED ORDER — MISOPROSTOL 200 MCG PO TABS: 1000.0000 ug | ORAL_TABLET | Freq: Once | ORAL | Status: DC

## 2011-01-30 MED ORDER — LABETALOL HCL 5 MG/ML IV SOLN
10.0000 mg | INTRAVENOUS | Status: DC | PRN
  Filled 2011-01-30: qty 4

## 2011-01-30 MED ORDER — OXYCODONE-ACETAMINOPHEN 5-325 MG PO TABS
1.0000 | ORAL_TABLET | ORAL | Status: DC | PRN
  Administered 2011-01-30: 1 via ORAL
  Filled 2011-01-30: qty 1

## 2011-01-30 MED ORDER — BENZOCAINE-MENTHOL 20-0.5 % EX AERO
INHALATION_SPRAY | CUTANEOUS | Status: AC
  Administered 2011-01-30: 1 via TOPICAL
  Filled 2011-01-30: qty 56

## 2011-01-30 MED ORDER — WITCH HAZEL-GLYCERIN EX PADS: 1.0000 "application " | MEDICATED_PAD | CUTANEOUS | Status: DC | PRN

## 2011-01-30 MED ORDER — LANOLIN HYDROUS EX OINT: TOPICAL_OINTMENT | CUTANEOUS | Status: DC | PRN

## 2011-01-30 MED ORDER — MISOPROSTOL 200 MCG PO TABS
ORAL_TABLET | ORAL | Status: AC
  Administered 2011-01-30: 1000 ug
  Filled 2011-01-30: qty 5

## 2011-01-30 MED ORDER — PRENATAL MULTIVITAMIN CH
1.0000 | ORAL_TABLET | Freq: Every day | ORAL | Status: DC
  Administered 2011-02-01: 1 via ORAL
  Filled 2011-01-30: qty 1

## 2011-01-30 MED ORDER — LACTATED RINGERS IV SOLN
INTRAVENOUS | Status: DC
  Administered 2011-01-30 – 2011-01-31 (×3): via INTRAVENOUS

## 2011-01-30 MED ORDER — ONDANSETRON HCL 4 MG/2ML IJ SOLN: 4.0000 mg | INTRAMUSCULAR | Status: DC | PRN

## 2011-01-30 MED ORDER — LABETALOL HCL 300 MG PO TABS
300.0000 mg | ORAL_TABLET | Freq: Three times a day (TID) | ORAL | Status: DC
  Administered 2011-01-30 – 2011-02-01 (×6): 300 mg via ORAL
  Filled 2011-01-30 (×10): qty 1

## 2011-01-30 MED ORDER — ONDANSETRON HCL 4 MG PO TABS: 4.0000 mg | ORAL_TABLET | ORAL | Status: DC | PRN

## 2011-01-30 MED ORDER — ZOLPIDEM TARTRATE 5 MG PO TABS: 5.0000 mg | ORAL_TABLET | Freq: Every evening | ORAL | Status: DC | PRN

## 2011-01-30 MED ORDER — DIPHENHYDRAMINE HCL 25 MG PO CAPS: 25.0000 mg | ORAL_CAPSULE | Freq: Four times a day (QID) | ORAL | Status: DC | PRN

## 2011-01-30 MED ORDER — SIMETHICONE 80 MG PO CHEW: 80.0000 mg | CHEWABLE_TABLET | ORAL | Status: DC | PRN

## 2011-01-30 MED ORDER — PRENATAL MULTIVITAMIN CH: 1.0000 | ORAL_TABLET | Freq: Every day | ORAL | Status: DC

## 2011-01-30 NOTE — Progress Notes (Signed)
Subjective: Pt's pain in lower abd., suprapubic area increasing again around 0300.  Also still throwing up about once an hr.  Is eating some ice chips.  Received bolus over the last hr.  Pt complete and +1, so attempted to push with 4 ctxs, but no movement of head.  D/c'd foley and reinserted new one, difficulty threading secondary to fetal position, but lifted fetal head and RN threaded and better urine return w/ new catheter position.  Pt's mom and husband remain at bedside.  UOP is meeting minimum or better requirement for hrly output.  Objective: BP 151/100  Pulse 128  Temp(Src) 98 F (36.7 C) (Oral)  Resp 20  Ht 4\' 11"  (1.499 m)  Wt 67.132 kg (148 lb)  BMI 29.89 kg/m2  SpO2 98% I/O last 3 completed shifts: In: 3144.5 [P.O.:1740; I.V.:1404.5] Out: 2525 [Urine:2425; Emesis/NG output:100] Total I/O In: 1427.8 [P.O.:240; I.V.:1187.8] Out: 715 [Urine:495; Emesis/NG output:220] .Marland Kitchen Filed Vitals:   01/30/11 0201 01/30/11 0231 01/30/11 0301 01/30/11 0340  BP: 128/79 139/92 151/100 150/95  Pulse: 116 112 128 107  Temp:    98 F (36.7 C)  TempSrc:    Oral  Resp: 20 20 20 20   Height:      Weight:      SpO2:       FHT:  FHR: 135 bpm, variability: moderate,  accelerations:  Present,  decelerations:  Absent UC:   regular, every 3-4 minutes SVE:   Dilation: 10 Effacement (%): 100 Station: +1 Exam by:: S.   Assessment / Plan: 1.  37.1 weeks  2.  Begin second stage  3.  Cont'd n/v  4.  Pain not fully controlled w/ epidural   Preeclampsia:  on magnesium sulfate and intake and ouput balanced Fetal Wellbeing:  Category I Pain Control:  Epidural and freq use of PCA about every 1-2 hrs I/D:  n/a Anticipated MOD:  NSVD Rec'd laboring down further, and then restart pushing in 1-2 hrs, or prn.   C/w MD prn.  CTO BP and I/O closely. Tommy Minichiello H 01/30/2011, 3:47 AM

## 2011-01-30 NOTE — Progress Notes (Signed)
1345   pt in bathroom, states, " Im going to pass out."  Encouraged pt to take deep breaths and placed a cool rag on head and made pt smell ammonia. When pt smelled ammonia states,  " I  feel a little better now."  Proceeded to assist pt to stand up and change peri-pad.  Pt when standing stated, " Oh no, Im going to pass out."  Pt then flung head back, eyes rolled backed and then pt began to have seizure activity, this occurred for about 2-3 seconds.  Pt was lowered to floor and once on the floor she began talking, ask "Did I just pass out?"  Tried to explain events to pt and assure pt was alert and oriented.  Pt was alert and oriented x 3 and had no C/O pain.     Vitals- 145/95 98, 22 100% on non-re-breather, color within normal limits and skin warm and dry.  No s/s of post-ictal.   Assisted to bed with steady no further incidents.  Dr. Normand Sloop made aware of situation and up to evaluate pt.  See new orders.  Pt and family members state, " She passes out every time she sees blood.

## 2011-01-30 NOTE — Progress Notes (Signed)
Heather Daugherty, CNM made aware of urine output. Order to give 100cc LR bolus. Also made aware of exam at 0100, pt pain level and refusal to change positions. Stated she would come and assess patient in to hour.

## 2011-01-30 NOTE — Progress Notes (Signed)
Patient ID: Heather Daugherty, female   DOB: 02/05/1985, 26 y.o.   MRN: [id number] Subjective: Postpartum Day 0: Cesarean Delivery Patient reports tolerating PO, + flatus, + BM and no problems voiding.   CTSP secondary to questionable seizure vs passing out with seein blood   Objective: Vital signs in last 24 hours: Temp:  [98 F (36.7 C)-99.1 F (37.3 C)] 99.1 F (37.3 C) (01/16 1100) Pulse Rate:  [87-134] 101  (01/16 1350) Resp:  [15-20] 20  (01/16 1350) BP: (117-180)/(70-110) 145/95 mmHg (01/16 1350) SpO2:  [98 %-100 %] 100 % (01/16 1350) Weight:  [64.365 kg (141 lb 14.4 oz)] 64.365 kg (141 lb 14.4 oz) (01/16 1100)  Physical Exam:  General: alert no post ictal signs and symptoms Lochia: appropriate Uterine Fundus: firm Incision: no significant drainage DVT Evaluation: No evidence of DVT seen on physical exam.   Basename 01/30/11 0937 01/30/11 0600  HGB 11.7* 12.7  HCT 33.8* 36.4    Assessment/Plan: Status post Cesarean section. Doing well postoperatively.  Continue current care. Pt and her boyfriend states this is a typical syncopal episode when she sees blood.  Will check a magnesium level and cbc and monitor closely  Tola Meas A 01/30/2011, 2:23 PM

## 2011-01-30 NOTE — Progress Notes (Signed)
Subjective: Still struggling w/ n/v w/ position changes.  Slept after epidural redosed and still drowsy now, but stated, "I'm just ready for this to be over."  Family remains at bedside resting.  Pt had remained in Lt exaggerated Sims for extended time, but after this exam, repositioned to Rt exaggerated Sims.  RN reported decreased in UOP, although still >30cc/hr, but likely due to pt's lack of po intake and emesis.  During cervical exam, pt braced hand on RLQ during ctx.  RN also reported IUPC came before MN when pt repositioned.    Objective: BP 140/78  Pulse 102  Temp(Src) 98.8 F (37.1 C) (Oral)  Resp 20  Ht 4\' 11"  (1.499 m)  Wt 67.132 kg (148 lb)  BMI 29.89 kg/m2  SpO2 98% I/O last 3 completed shifts: In: 3144.5 [P.O.:1740; I.V.:1404.5] Out: 2525 [Urine:2425; Emesis/NG output:100] Total I/O In: 741.8 [P.O.:240; I.V.:501.8] Out: 575 [Urine:400; Emesis/NG output:175] .Marland Kitchen Filed Vitals:   01/29/11 2308 01/29/11 2311 01/29/11 2331 01/30/11 0001  BP:  124/71 136/87 140/78  Pulse: 102 101 108 102  Temp:      TempSrc:      Resp:  18 20 20   Height:      Weight:      SpO2: 98%      FHT:  FHR: 130 bpm, variability: moderate,  accelerations:  Present,  decelerations:  Absent UC:   regular, every 2-3 minutes SVE:   Dilation: 9 Effacement (%): 90 Station: -1;0 Exam by:: H. Emerick Weatherly, CNM; cx mainly on pt's Lt side and fetus asynclitic  Assessment / Plan: 1.  IUP at 37.1  2. Transition  3.  positional n/v  4.  BP stable, but overall decrease in UOP over last few hrs  5. Mild PreE  Labor: Progressing normally Preeclampsia:  on magnesium sulfate Fetal Wellbeing:  Category I Pain Control:  Epidural I/D:  n/a Anticipated MOD:  NSVD Plan to labor down, or push w/ urge.  Will bolus LR and CTO UOP closely.   C/w MD prn.  Arin Peral H 01/30/2011, 12:21 AM

## 2011-01-31 LAB — COMPREHENSIVE METABOLIC PANEL
ALT: 22 U/L (ref 0–35)
AST: 19 U/L (ref 0–37)
Calcium: 6.8 mg/dL — ABNORMAL LOW (ref 8.4–10.5)
Creatinine, Ser: 0.72 mg/dL (ref 0.50–1.10)
Sodium: 134 mEq/L — ABNORMAL LOW (ref 135–145)
Total Protein: 4.6 g/dL — ABNORMAL LOW (ref 6.0–8.3)

## 2011-01-31 LAB — CBC
HCT: 23.2 % — ABNORMAL LOW (ref 36.0–46.0)
Hemoglobin: 8.1 g/dL — ABNORMAL LOW (ref 12.0–15.0)
MCHC: 34.9 g/dL (ref 30.0–36.0)
MCV: 92.1 fL (ref 78.0–100.0)
RDW: 13 % (ref 11.5–15.5)
WBC: 20 10*3/uL — ABNORMAL HIGH (ref 4.0–10.5)

## 2011-01-31 LAB — DIFFERENTIAL
Basophils Absolute: 0 10*3/uL (ref 0.0–0.1)
Eosinophils Relative: 0 % (ref 0–5)
Lymphocytes Relative: 14 % (ref 12–46)
Lymphs Abs: 2.8 10*3/uL (ref 0.7–4.0)
Monocytes Absolute: 1.7 10*3/uL — ABNORMAL HIGH (ref 0.1–1.0)
Neutro Abs: 15.4 10*3/uL — ABNORMAL HIGH (ref 1.7–7.7)

## 2011-01-31 LAB — LACTATE DEHYDROGENASE: LDH: 228 U/L (ref 94–250)

## 2011-01-31 LAB — URIC ACID: Uric Acid, Serum: 4.9 mg/dL (ref 2.4–7.0)

## 2011-01-31 MED ORDER — POLYSACCHARIDE IRON 150 MG PO CAPS
150.0000 mg | ORAL_CAPSULE | Freq: Every day | ORAL | Status: DC
  Administered 2011-01-31 – 2011-02-01 (×2): 150 mg via ORAL
  Filled 2011-01-31 (×3): qty 1

## 2011-01-31 NOTE — Progress Notes (Signed)
Orthostatic VS checked as ordered:  Lying - 126/75, HR - 91 Sitting - 115/64, HR - 89 Standing - 115/80, HR - 103

## 2011-01-31 NOTE — Anesthesia Postprocedure Evaluation (Signed)
  Anesthesia Post-op Note  Patient: Heather Daugherty  Procedure(s) Performed: * No procedures listed *  Patient Location: PACU and A-ICU  Anesthesia Type: Epidural  Level of Consciousness: awake, alert  and oriented  Airway and Oxygen Therapy: Patient Spontanous Breathing   Post-op Assessment: Post-op Vital signs reviewed, Patient's Cardiovascular Status Stable, No headache, No backache, No residual numbness and No residual motor weakness  Post-op Vital Signs: Reviewed and stable  Complications: No apparent anesthesia complications

## 2011-01-31 NOTE — Progress Notes (Signed)
UR chart review completed.  

## 2011-01-31 NOTE — Progress Notes (Addendum)
Post Partum Day 1--In AICU.   Subjective: no complaints.  Feeling well.  Denies HA, visual symptoms, epigastric pain.  Breastfeeding.  Infant doing well.   No SOB or chest pain.  Denies syncope or dizziness--ambulated to bedside commode.  Objective: Blood pressure 114/78, pulse 91, temperature 98.2 F (36.8 C), temperature source Oral, resp. rate 16, height 4\' 11"  (1.499 m), weight 142 lb 9.6 oz (64.683 kg), SpO2 99.00%, unknown if currently breastfeeding.  +1583 cc fluid balance last 24 hours  Weight 141 on 01/30/11.  Filed Vitals:   01/31/11 0600 01/31/11 0700 01/31/11 0719 01/31/11 0800  BP: 101/58 88/50 111/68 114/78  Pulse: 83 84 87 91  Temp:    98.2 F (36.8 C)  TempSrc:    Oral  Resp: 18   16  Height:      Weight:      SpO2: 98% 98% 98% 99%    Physical Exam:  General: alert Lochia: appropriate Uterine Fundus: firm Incision: healing well, 2nd degree DVT Evaluation: No evidence of DVT seen on physical exam. Negative Homan's sign.  DTR 2+, trace/1+ edema, no clonus.    Basename 01/31/11 0520 01/30/11 1520  HGB 8.1* 9.9*  HCT 23.2* 28.6*   Results for orders placed during the hospital encounter of 01/22/11 (from the past 24 hour(s))  MRSA PCR SCREENING     Status: Normal   Collection Time   01/30/11 11:30 AM      Component Value Range   MRSA by PCR NEGATIVE  NEGATIVE   CBC     Status: Abnormal   Collection Time   01/30/11  3:20 PM      Component Value Range   WBC 27.9 (*) 4.0 - 10.5 (K/uL)   RBC 3.12 (*) 3.87 - 5.11 (MIL/uL)   Hemoglobin 9.9 (*) 12.0 - 15.0 (g/dL)   HCT 16.1 (*) 09.6 - 46.0 (%)   MCV 91.7  78.0 - 100.0 (fL)   MCH 31.7  26.0 - 34.0 (pg)   MCHC 34.6  30.0 - 36.0 (g/dL)   RDW 04.5  40.9 - 81.1 (%)   Platelets 335  150 - 400 (K/uL)  MAGNESIUM     Status: Abnormal   Collection Time   01/30/11  3:20 PM      Component Value Range   Magnesium 5.1 (*) 1.5 - 2.5 (mg/dL)  URIC ACID     Status: Normal   Collection Time   01/31/11  5:20 AM   Component Value Range   Uric Acid, Serum 4.9  2.4 - 7.0 (mg/dL)  LACTATE DEHYDROGENASE     Status: Normal   Collection Time   01/31/11  5:20 AM      Component Value Range   LD 228  94 - 250 (U/L)  COMPREHENSIVE METABOLIC PANEL     Status: Abnormal   Collection Time   01/31/11  5:20 AM      Component Value Range   Sodium 134 (*) 135 - 145 (mEq/L)   Potassium 3.7  3.5 - 5.1 (mEq/L)   Chloride 102  96 - 112 (mEq/L)   CO2 27  19 - 32 (mEq/L)   Glucose, Bld 116 (*) 70 - 99 (mg/dL)   BUN 12  6 - 23 (mg/dL)   Creatinine, Ser 9.14  0.50 - 1.10 (mg/dL)   Calcium 6.8 (*) 8.4 - 10.5 (mg/dL)   Total Protein 4.6 (*) 6.0 - 8.3 (g/dL)   Albumin 1.8 (*) 3.5 - 5.2 (g/dL)   AST 19  0 - 37 (U/L)   ALT 22  0 - 35 (U/L)   Alkaline Phosphatase 99  39 - 117 (U/L)   Total Bilirubin 0.2 (*) 0.3 - 1.2 (mg/dL)   GFR calc non Af Amer >90  >90 (mL/min)   GFR calc Af Amer >90  >90 (mL/min)  DIFFERENTIAL     Status: Abnormal   Collection Time   01/31/11  5:20 AM      Component Value Range   Neutrophils Relative 77  43 - 77 (%)   Neutro Abs 15.4 (*) 1.7 - 7.7 (K/uL)   Lymphocytes Relative 14  12 - 46 (%)   Lymphs Abs 2.8  0.7 - 4.0 (K/uL)   Monocytes Relative 9  3 - 12 (%)   Monocytes Absolute 1.7 (*) 0.1 - 1.0 (K/uL)   Eosinophils Relative 0  0 - 5 (%)   Eosinophils Absolute 0.1  0.0 - 0.7 (K/uL)   Basophils Relative 0  0 - 1 (%)   Basophils Absolute 0.0  0.0 - 0.1 (K/uL)  CBC     Status: Abnormal   Collection Time   01/31/11  5:20 AM      Component Value Range   WBC 20.0 (*) 4.0 - 10.5 (K/uL)   RBC 2.52 (*) 3.87 - 5.11 (MIL/uL)   Hemoglobin 8.1 (*) 12.0 - 15.0 (g/dL)   HCT 16.1 (*) 09.6 - 46.0 (%)   MCV 92.1  78.0 - 100.0 (fL)   MCH 32.1  26.0 - 34.0 (pg)   MCHC 34.9  30.0 - 36.0 (g/dL)   RDW 04.5  40.9 - 81.1 (%)   Platelets 283  150 - 400 (K/uL)    Assessment/Plan: Day 1 vaginal delivery Pre-eclampsia--24 hours of magnesium at 8am today Positive fluid balance Mild leukocytosis, but  improving Anemia, without hemodynamic instability  Plan: Continue Magnesium until re-evaluation around noon. Check orthostatics FeSO4 Dr. Pennie Rushing will follow.   LOS: 9 days   Heather Daugherty, Heather Daugherty 01/31/2011, 10:28 AM    Pt had Magnesium discontinued at 2 pm after beginning to diurese.  She denies symptoms of headache, abdominal pain or visual changes. BP's have remained normal and patient has been transferred to [facility] with anticipation of discharge home on 02/01/11

## 2011-02-01 MED ORDER — LABETALOL HCL 300 MG PO TABS: 300.0000 mg | ORAL_TABLET | Freq: Two times a day (BID) | ORAL | Status: DC

## 2011-02-01 MED ORDER — SENNOSIDES-DOCUSATE SODIUM 8.6-50 MG PO TABS: 2.0000 | ORAL_TABLET | Freq: Every day | ORAL | Status: AC

## 2011-02-01 MED ORDER — IBUPROFEN 600 MG PO TABS: 600.0000 mg | ORAL_TABLET | Freq: Four times a day (QID) | ORAL | Status: AC | PRN

## 2011-02-01 NOTE — Discharge Summary (Signed)
Obstetric Discharge Summary Reason for Admission: pre eclampsia 36 weeks Prenatal Procedures: ultrasound Intrapartum Procedures: spontaneous vaginal delivery Postpartum Procedures: none Complications-Operative and Postpartum: 2 degree perineal laceration Hemoglobin  Date Value Range Status  01/31/2011 8.1* 12.0-15.0 (g/dL) Final     DELTA CHECK NOTED     REPEATED TO VERIFY     HCT  Date Value Range Status  01/31/2011 23.2* 36.0-46.0 (%) Final    Discharge Diagnoses: pre eclampsia, SVD, anemia  Discharge Information: Date: 02/01/2011 Activity: pelvic rest Diet: routine Medications: Ibuprofen, Iron and labetalol 300 mg BID Condition: stable Instructions: refer to practice specific booklet Discharge to: home Follow-up Information    Follow up with [facility] in 6 weeks. (smart start RN to see 1 week)        collaboration with Dr. Estanislado Pandy per telephone, smart start RN to follow up 1 week.  Newborn Data: Live born female  Birth Weight: 7 lb 3.2 oz (3266 g) APGAR: 8, 9  Home with mother.  Sollie Vultaggio 02/01/2011, 12:22 PM

## 2011-02-07 ENCOUNTER — Other Ambulatory Visit: Payer: Self-pay | Admitting: Obstetrics and Gynecology

## 2011-03-14 ENCOUNTER — Ambulatory Visit (INDEPENDENT_AMBULATORY_CARE_PROVIDER_SITE_OTHER): Payer: Medicaid Other

## 2011-10-14 IMAGING — US US OB COMP LESS 14 WK
1 series · 14 of 28 positions shown · non-contrast
Comparison: none

[Series 1: us ob comp less 14 wks · 32 acquisitions, 14 frames shown]
[im 2/32]
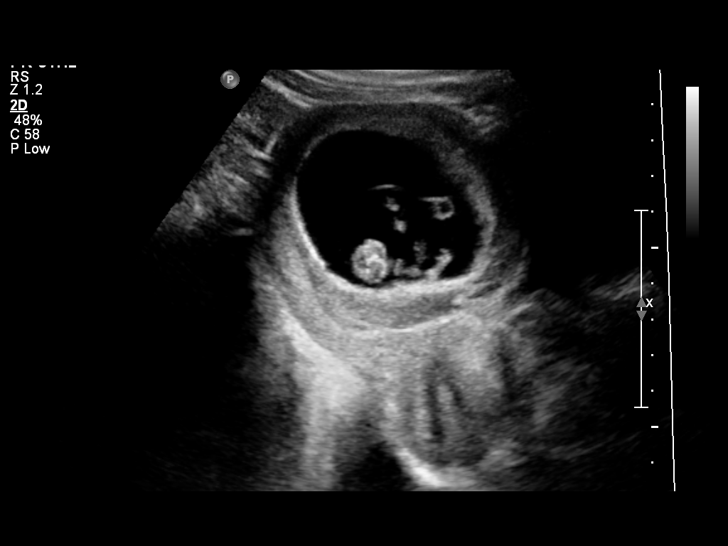
[im 4/32]
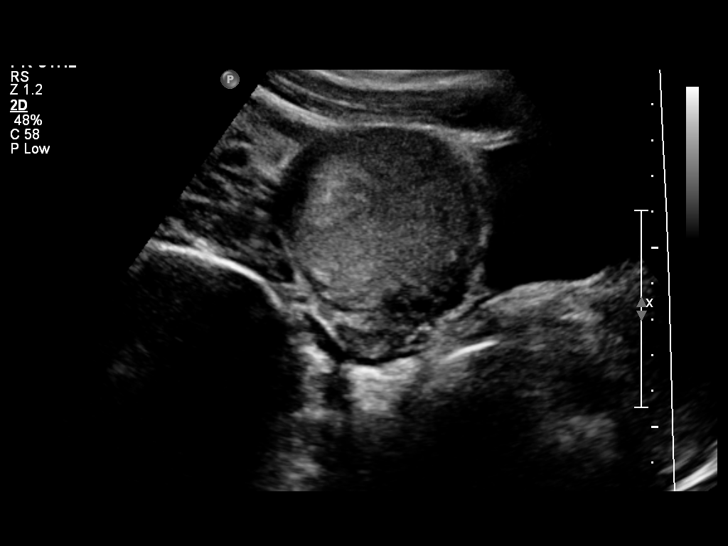
[im 6/32]
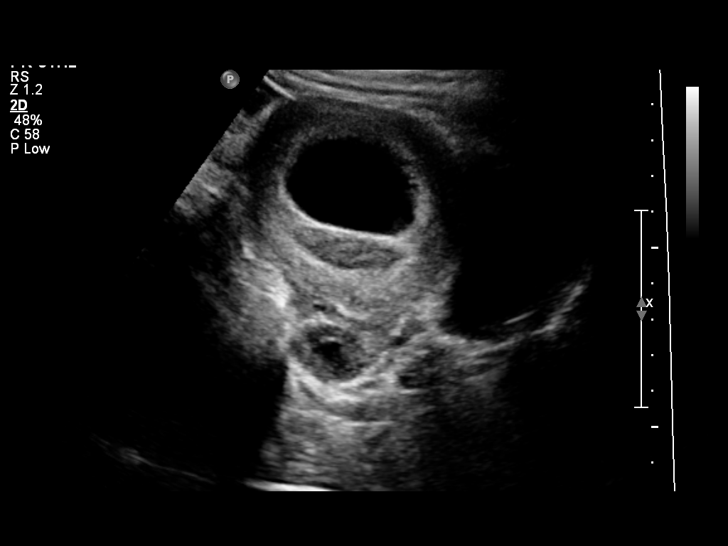
[im 9/32]
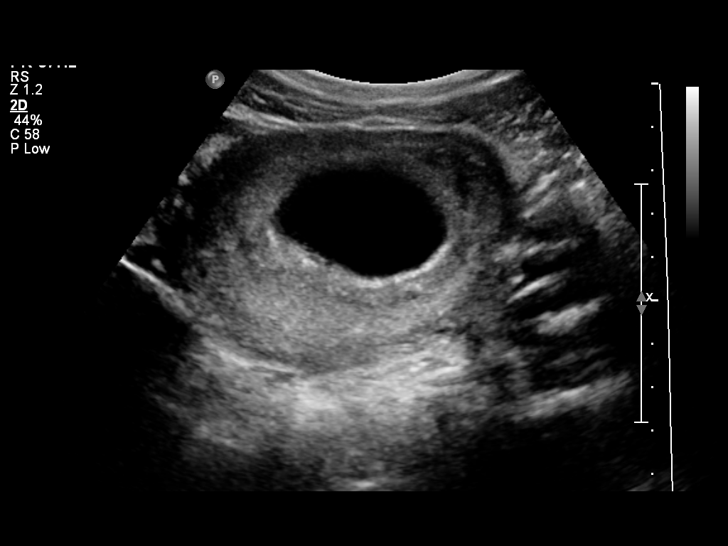
[im 11/32]
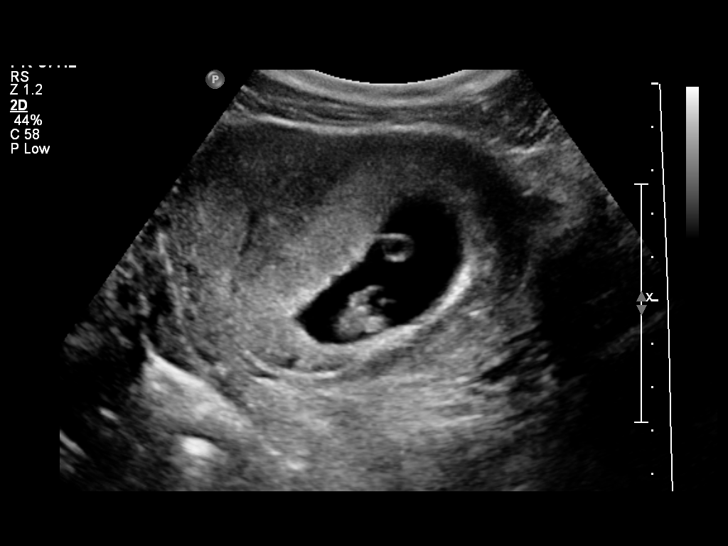
[im 13/32]
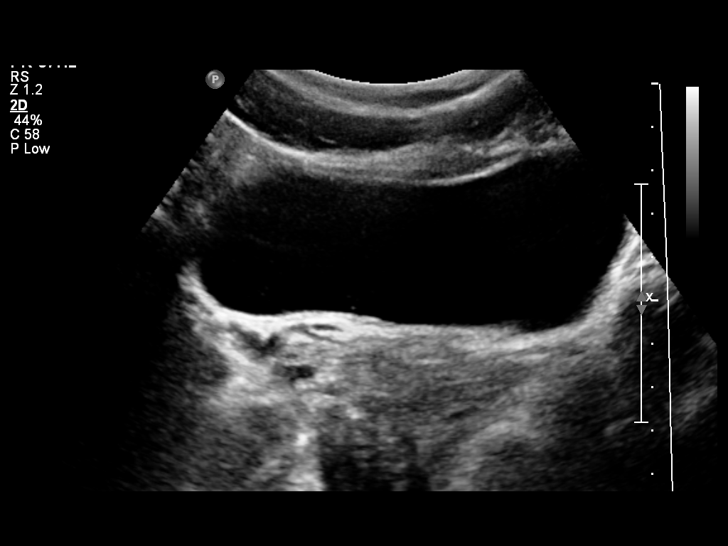
[im 15/32]
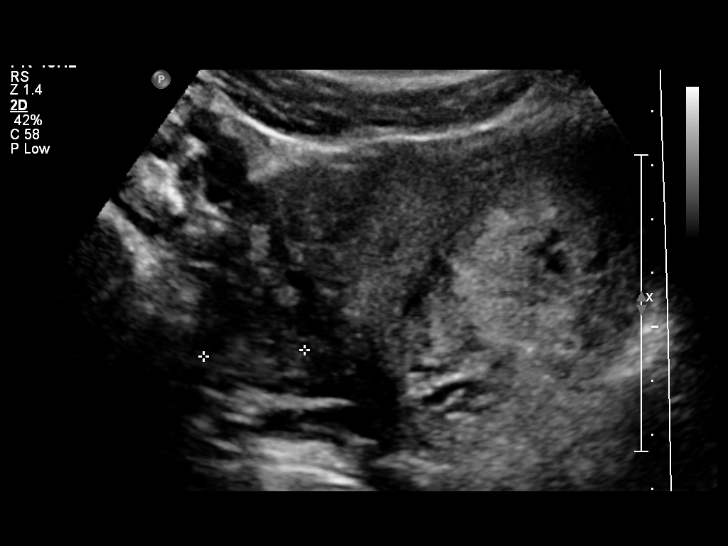
[im 18/32]
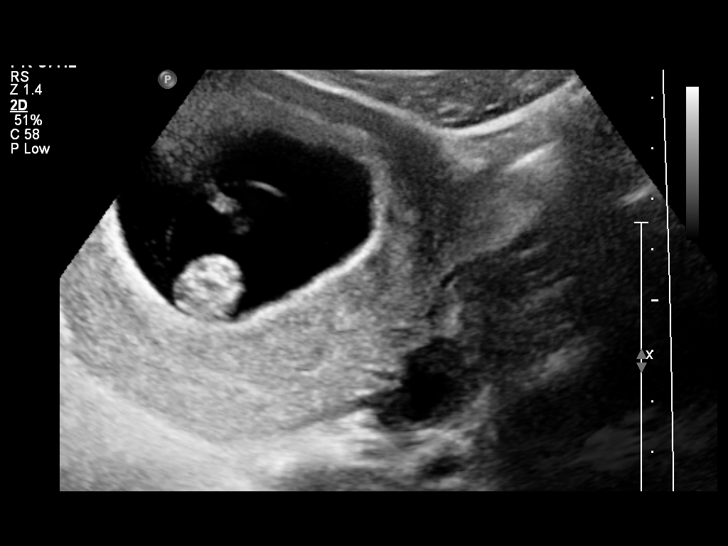
[im 20/32]
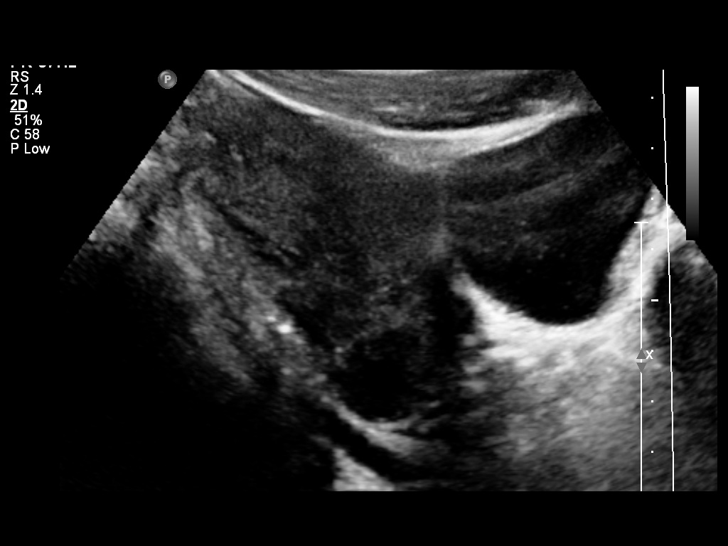
[im 22/32]
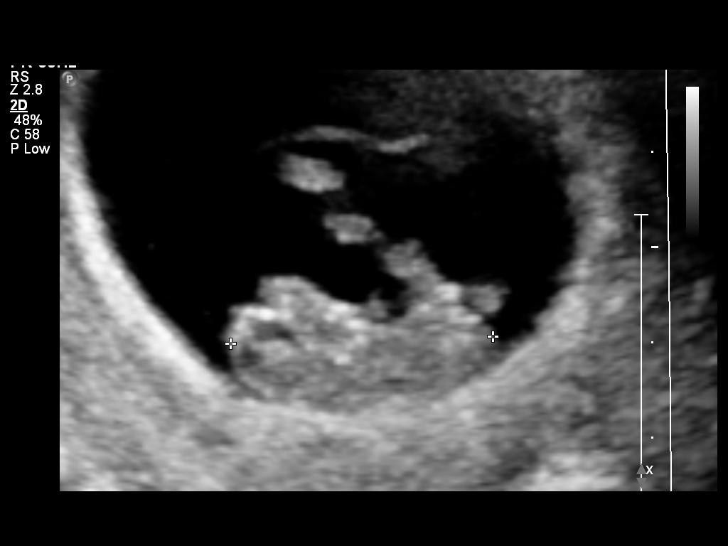
[im 25/32]
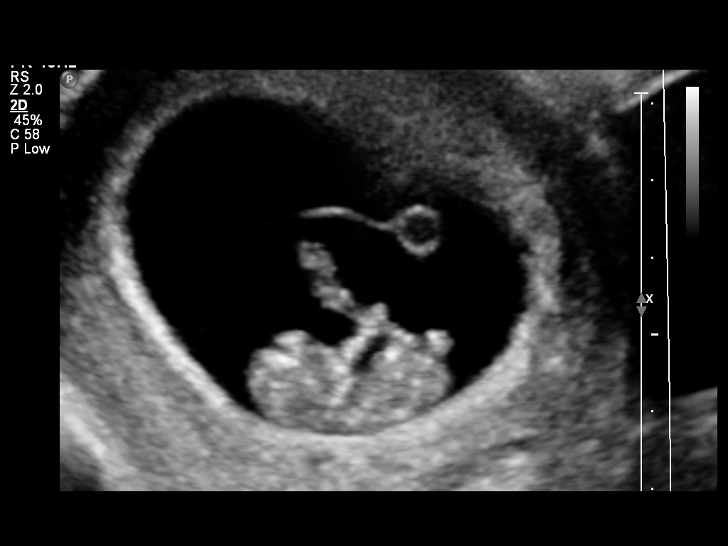
[im 27/32]
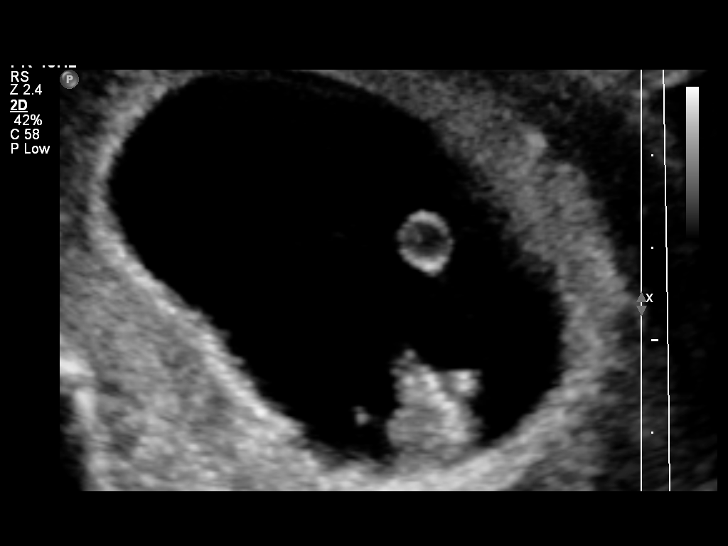
[im 29/32]
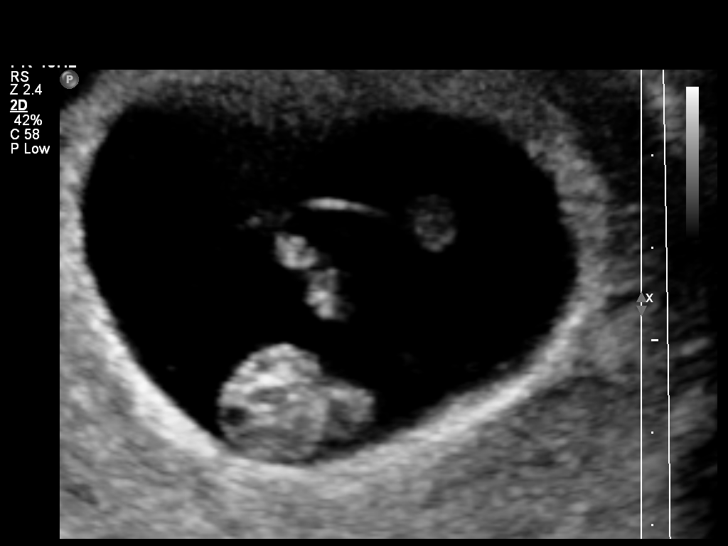
[im 32/32]
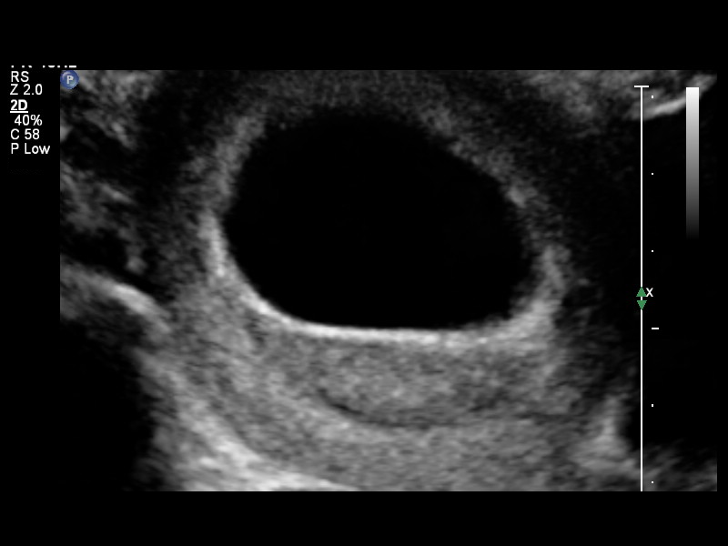

[14 of 28 positions shown; findings below may reference images not displayed]

OBSTETRICS REPORT
                      (Signed Final 07/16/2010 [DATE])

Procedures

 US OB COMP LESS 14 WKS                                76801.0
Indications

 Vaginal bleeding, unknown etiology
Fetal Evaluation

 Preg. Location:    Intrauterine
 Gest. Sac:         Intrauterine
 Yolk Sac:          Visualized
 Fetal Pole:        Visualized
 Fetal Heart Rate:  158                          bpm
 Cardiac Activity:  Observed
Biometry

 CRL:     27.1  mm     G. Age:  9w 2d                  EDD:    02/16/11
Gestational Age

 LMP:           8w 6d         Date:  05/15/10                 EDD:   02/19/11
 Best:          8w 6d      Det. By:  LMP  (05/15/10)          EDD:   02/19/11
Cervix Uterus Adnexa

 Cervix:       Closed.
 Left Ovary:    Within normal limits. Small corpus luteum noted.
 Right Ovary:   Within normal limits.

 Adnexa:     No abnormality visualized.
Impression

 Single living IUP.  US EGA is concordant with LMP.
 No significant maternal uterine or adnexal abnormality
 identified.

 questions or concerns.

## 2013-05-28 ENCOUNTER — Emergency Department (HOSPITAL_BASED_OUTPATIENT_CLINIC_OR_DEPARTMENT_OTHER)
Admission: EM | Admit: 2013-05-28 | Discharge: 2013-05-28 | Disposition: A | Discharge status: Home / Self Care | Payer: Medicaid Other | Attending: Emergency Medicine | Admitting: Emergency Medicine

## 2013-05-28 ENCOUNTER — Encounter (HOSPITAL_BASED_OUTPATIENT_CLINIC_OR_DEPARTMENT_OTHER): Payer: Self-pay | Admitting: Emergency Medicine

## 2013-05-28 ENCOUNTER — Emergency Department (HOSPITAL_BASED_OUTPATIENT_CLINIC_OR_DEPARTMENT_OTHER): Payer: Medicaid Other

## 2013-05-28 DIAGNOSIS — Z3202 Encounter for pregnancy test, result negative: Secondary | ICD-10-CM | POA: Insufficient documentation

## 2013-05-28 DIAGNOSIS — N83209 Unspecified ovarian cyst, unspecified side: Secondary | ICD-10-CM | POA: Insufficient documentation

## 2013-05-28 DIAGNOSIS — Z87440 Personal history of urinary (tract) infections: Secondary | ICD-10-CM | POA: Insufficient documentation

## 2013-05-28 DIAGNOSIS — R112 Nausea with vomiting, unspecified: Secondary | ICD-10-CM | POA: Insufficient documentation

## 2013-05-28 DIAGNOSIS — Z87442 Personal history of urinary calculi: Secondary | ICD-10-CM | POA: Insufficient documentation

## 2013-05-28 DIAGNOSIS — R1110 Vomiting, unspecified: Secondary | ICD-10-CM

## 2013-05-28 DIAGNOSIS — Z87891 Personal history of nicotine dependence: Secondary | ICD-10-CM | POA: Insufficient documentation

## 2013-05-28 DIAGNOSIS — Z79899 Other long term (current) drug therapy: Secondary | ICD-10-CM | POA: Insufficient documentation

## 2013-05-28 DIAGNOSIS — I10 Essential (primary) hypertension: Secondary | ICD-10-CM | POA: Insufficient documentation

## 2013-05-28 DIAGNOSIS — Z8719 Personal history of other diseases of the digestive system: Secondary | ICD-10-CM | POA: Insufficient documentation

## 2013-05-28 LAB — PREGNANCY, URINE: PREG TEST UR: NEGATIVE

## 2013-05-28 LAB — CBC WITH DIFFERENTIAL/PLATELET
BASOS ABS: 0 10*3/uL (ref 0.0–0.1)
Basophils Relative: 0 % (ref 0–1)
Eosinophils Absolute: 0 10*3/uL (ref 0.0–0.7)
Eosinophils Relative: 0 % (ref 0–5)
HEMATOCRIT: 38.9 % (ref 36.0–46.0)
Hemoglobin: 13.8 g/dL (ref 12.0–15.0)
LYMPHS PCT: 5 % — AB (ref 12–46)
Lymphs Abs: 1.1 10*3/uL (ref 0.7–4.0)
MCH: 32.6 pg (ref 26.0–34.0)
MCHC: 35.5 g/dL (ref 30.0–36.0)
MCV: 92 fL (ref 78.0–100.0)
MONO ABS: 1 10*3/uL (ref 0.1–1.0)
Monocytes Relative: 5 % (ref 3–12)
NEUTROS ABS: 18 10*3/uL — AB (ref 1.7–7.7)
Neutrophils Relative %: 89 % — ABNORMAL HIGH (ref 43–77)
PLATELETS: 359 10*3/uL (ref 150–400)
RBC: 4.23 MIL/uL (ref 3.87–5.11)
RDW: 11.6 % (ref 11.5–15.5)
WBC: 20.2 10*3/uL — AB (ref 4.0–10.5)

## 2013-05-28 LAB — URINE MICROSCOPIC-ADD ON

## 2013-05-28 LAB — COMPREHENSIVE METABOLIC PANEL
ALT: 16 U/L (ref 0–35)
AST: 18 U/L (ref 0–37)
Albumin: 4.9 g/dL (ref 3.5–5.2)
Alkaline Phosphatase: 38 U/L — ABNORMAL LOW (ref 39–117)
BILIRUBIN TOTAL: 0.6 mg/dL (ref 0.3–1.2)
BUN: 16 mg/dL (ref 6–23)
CHLORIDE: 102 meq/L (ref 96–112)
CO2: 21 meq/L (ref 19–32)
Calcium: 9.9 mg/dL (ref 8.4–10.5)
Creatinine, Ser: 0.7 mg/dL (ref 0.50–1.10)
GFR calc Af Amer: >90 mL/min (ref >90)
Glucose, Bld: 194 mg/dL — ABNORMAL HIGH (ref 70–99)
POTASSIUM: 3.9 meq/L (ref 3.7–5.3)
SODIUM: 141 meq/L (ref 137–147)
Total Protein: 7.3 g/dL (ref 6.0–8.3)

## 2013-05-28 LAB — RAPID URINE DRUG SCREEN, HOSP PERFORMED
Amphetamines: NOT DETECTED
Barbiturates: NOT DETECTED
Benzodiazepines: POSITIVE — AB
COCAINE: NOT DETECTED
OPIATES: NOT DETECTED
Tetrahydrocannabinol: POSITIVE — AB

## 2013-05-28 LAB — URINALYSIS, ROUTINE W REFLEX MICROSCOPIC
Bilirubin Urine: NEGATIVE
GLUCOSE, UA: NEGATIVE mg/dL
HGB URINE DIPSTICK: NEGATIVE
KETONES UR: 40 mg/dL — AB
Nitrite: NEGATIVE
PROTEIN: NEGATIVE mg/dL
Specific Gravity, Urine: 1.011 (ref 1.005–1.030)
Urobilinogen, UA: 0.2 mg/dL (ref 0.0–1.0)
pH: 8 (ref 5.0–8.0)

## 2013-05-28 LAB — ETHANOL: Alcohol, Ethyl (B): <11 mg/dL (ref 0–11)

## 2013-05-28 LAB — LIPASE, BLOOD: Lipase: 12 U/L (ref 11–59)

## 2013-05-28 LAB — CBG MONITORING, ED: GLUCOSE-CAPILLARY: 131 mg/dL — AB (ref 70–99)

## 2013-05-28 MED ORDER — KETOROLAC TROMETHAMINE 30 MG/ML IJ SOLN
30.0000 mg | Freq: Once | INTRAMUSCULAR | Status: AC
  Administered 2013-05-28: 30 mg via INTRAVENOUS
  Filled 2013-05-28: qty 1

## 2013-05-28 MED ORDER — PROMETHAZINE HCL 25 MG PO TABS: 25.0000 mg | ORAL_TABLET | Freq: Four times a day (QID) | ORAL | Status: DC | PRN

## 2013-05-28 MED ORDER — ONDANSETRON HCL 4 MG/2ML IJ SOLN
INTRAMUSCULAR | Status: AC
  Administered 2013-05-28: 4 mg via INTRAVENOUS
  Filled 2013-05-28: qty 2

## 2013-05-28 MED ORDER — IOHEXOL 300 MG/ML  SOLN
100.0000 mL | Freq: Once | INTRAMUSCULAR | Status: AC | PRN
  Administered 2013-05-28: 100 mL via INTRAVENOUS

## 2013-05-28 MED ORDER — PROMETHAZINE HCL 25 MG/ML IJ SOLN
12.5000 mg | Freq: Once | INTRAMUSCULAR | Status: DC
  Administered 2013-05-28: 12.5 mg via INTRAVENOUS
  Filled 2013-05-28: qty 1

## 2013-05-28 MED ORDER — SODIUM CHLORIDE 0.9 % IV BOLUS (SEPSIS)
1000.0000 mL | Freq: Once | INTRAVENOUS | Status: AC
  Administered 2013-05-28: 1000 mL via INTRAVENOUS

## 2013-05-28 MED ORDER — LORAZEPAM 2 MG/ML IJ SOLN: 1.0000 mg | Freq: Once | INTRAMUSCULAR | Status: DC

## 2013-05-28 MED ORDER — FENTANYL CITRATE 0.05 MG/ML IJ SOLN
50.0000 ug | Freq: Once | INTRAMUSCULAR | Status: AC
  Administered 2013-05-28: 50 ug via INTRAVENOUS
  Filled 2013-05-28: qty 2

## 2013-05-28 MED ORDER — IOHEXOL 300 MG/ML  SOLN
50.0000 mL | Freq: Once | INTRAMUSCULAR | Status: AC | PRN
  Administered 2013-05-28: 50 mL via ORAL

## 2013-05-28 MED ORDER — ONDANSETRON HCL 4 MG/2ML IJ SOLN
4.0000 mg | Freq: Once | INTRAMUSCULAR | Status: AC
  Administered 2013-05-28: 4 mg via INTRAVENOUS

## 2013-05-28 NOTE — ED Notes (Signed)
Pt returned from CT °

## 2013-05-28 NOTE — ED Notes (Signed)
Vomiting that started this am

## 2013-05-28 NOTE — ED Notes (Signed)
Patient transported to CT 

## 2013-05-28 NOTE — ED Notes (Signed)
Significant other reports pt has been under a lot of stress related to family illness.  Awakened this am "not feeling good" and developed nausea an vomiting.  He states "this happens when she is stressed but usually takes a xanax before it gets to this point".  Pt appears anxious,  is rolling all over bed, kicking her legs and moaning out loud.  SO provided history due to pt uncooperative in answering questions.  Admits to smoking THC daily but did not yesterday.

## 2013-05-28 NOTE — Discharge Instructions (Signed)
Nausea and Vomiting °Nausea is a sick feeling that often comes before throwing up (vomiting). Vomiting is a reflex where stomach contents come out of your mouth. Vomiting can cause severe loss of body fluids (dehydration). Children and elderly adults can become dehydrated quickly, especially if they also have diarrhea. Nausea and vomiting are symptoms of a condition or disease. It is important to find the cause of your symptoms. °CAUSES  °· Direct irritation of the stomach lining. This irritation can result from increased acid production (gastroesophageal reflux disease), infection, food poisoning, taking certain medicines (such as nonsteroidal anti-inflammatory drugs), alcohol use, or tobacco use. °· Signals from the brain. These signals could be caused by a headache, heat exposure, an inner ear disturbance, increased pressure in the brain from injury, infection, a tumor, or a concussion, pain, emotional stimulus, or metabolic problems. °· An obstruction in the gastrointestinal tract (bowel obstruction). °· Illnesses such as diabetes, hepatitis, gallbladder problems, appendicitis, kidney problems, cancer, sepsis, atypical symptoms of a heart attack, or eating disorders. °· Medical treatments such as chemotherapy and radiation. °· Receiving medicine that makes you sleep (general anesthetic) during surgery. °DIAGNOSIS °Your caregiver may ask for tests to be done if the problems do not improve after a few days. Tests may also be done if symptoms are severe or if the reason for the nausea and vomiting is not clear. Tests may include: °· Urine tests. °· Blood tests. °· Stool tests. °· Cultures (to look for evidence of infection). °· X-rays or other imaging studies. °Test results can help your caregiver make decisions about treatment or the need for additional tests. °TREATMENT °You need to stay well hydrated. Drink frequently but in small amounts. You may wish to drink water, sports drinks, clear broth, or eat frozen  ice pops or gelatin dessert to help stay hydrated. When you eat, eating slowly may help prevent nausea. There are also some antinausea medicines that may help prevent nausea. °HOME CARE INSTRUCTIONS  °· Take all medicine as directed by your caregiver. °· If you do not have an appetite, do not force yourself to eat. However, you must continue to drink fluids. °· If you have an appetite, eat a normal diet unless your caregiver tells you differently. °· Eat a variety of complex carbohydrates (rice, wheat, potatoes, bread), lean meats, yogurt, fruits, and vegetables. °· Avoid high-fat foods because they are more difficult to digest. °· Drink enough water and fluids to keep your urine clear or pale yellow. °· If you are dehydrated, ask your caregiver for specific rehydration instructions. Signs of dehydration may include: °· Severe thirst. °· Dry lips and mouth. °· Dizziness. °· Dark urine. °· Decreasing urine frequency and amount. °· Confusion. °· Rapid breathing or pulse. °SEEK IMMEDIATE MEDICAL CARE IF:  °· You have blood or brown flecks (like coffee grounds) in your vomit. °· You have black or bloody stools. °· You have a severe headache or stiff neck. °· You are confused. °· You have severe abdominal pain. °· You have chest pain or trouble breathing. °· You do not urinate at least once every 8 hours. °· You develop cold or clammy skin. °· You continue to vomit for longer than 24 to 48 hours. °· You have a fever. °MAKE SURE YOU:  °· Understand these instructions. °· Will watch your condition. °· Will get help right away if you are not doing well or get worse. °Document Released: 12/31/2004 Document Revised: 03/25/2011 Document Reviewed: 05/30/2010 °ExitCare® Patient Information ©2014 [employer]. ° ° °Emergency Department Resource   Guide °1) Find a Doctor and Pay Out of Pocket °Although you won't have to find out who is covered by your insurance plan, it is a good idea to ask around and get recommendations. You will  then need to call the office and see if the doctor you have chosen will accept you as a new patient and what types of options they offer for patients who are self-pay. Some doctors offer discounts or will set up payment plans for their patients who do not have insurance, but you will need to ask so you aren't surprised when you get to your appointment. ° °2) Contact Your Local Health Department °Not all health departments have doctors that can see patients for sick visits, but many do, so it is worth a call to see if yours does. If you don't know where your local health department is, you can check in your phone book. The CDC also has a tool to help you locate your state's health department, and many state websites also have listings of all of their local health departments. ° °3) Find a Walk-in Clinic °If your illness is not likely to be very severe or complicated, you may want to try a walk in clinic. These are popping up all over the country in pharmacies, drugstores, and shopping centers. They're usually staffed by nurse practitioners or physician assistants that have been trained to treat common illnesses and complaints. They're usually fairly quick and inexpensive. However, if you have serious medical issues or chronic medical problems, these are probably not your best option. ° °No Primary Care Doctor: °- Call [facility] at  832-8000 - they can help you locate a primary care doctor that  accepts your insurance, provides certain services, etc. °- Physician Referral Service- 1-800-533-3463 ° °Chronic Pain Problems: °Organization         Address  Phone   Notes  °[facility]  (336) 297-2271 Patients need to be referred by their primary care doctor.  ° °Medication Assistance: °Organization         Address  Phone   Notes  °[place] [facility] [address] °[place], [place] [address] (336) 641-8030 --Must be a resident of [place] °-- Must have NO  insurance coverage whatsoever (no Medicaid/ Medicare, etc.) °-- The pt. MUST have a primary care doctor that directs their care regularly and follows them in the community °  °[facility]  (866) 331-1348   °[employer]  (888) 892-1162   ° °Agencies that provide inexpensive medical care: °Organization         Address  Phone   Notes  °[facility]  (336) 832-8035   ° Internal Medicine    (336) 832-7272   °[facility] [address] °[place], [place] [address] (336) 832-4777   °[facility] [address], °[place] (336) 271-4999   °[facility]    (336) 373-0678   °[facility]    (336) 272-1050   °[facility] ° [address], [place] Phone:  (336) 832-4444, Fax:  (336) 832-4440 Hours of Operation:  9 am - 6 pm, M-F.  Also accepts Medicaid/Medicare and self-pay.  °[facility] ° [address], [place] Phone: (336) 832-3150, Fax: (336) [phone]. Hours of Operation:  8:30 am - 5:30 pm, M-F.  Also accepts Medicaid and self-pay.  °[facility] [place] [address],   [place] Phone: (336) 878-6027   °[facility] [address], [place], [place] (336)723-1848, Ext. 123 Mondays & Thursdays: 7-9 AM.  First 15 patients are seen on a first come, first serve basis. °  ° °Medicaid-accepting [place] Providers: ° °Organization         Address  Phone   Notes  °[facility] [address], [place] (336) 641-2100 Also accepts self-pay patients.  °[facility] [address], [place] ° (336) 856-9996   °[facility] [address], [place] (336) 288-8857   °[facility] [address] [place] [address], [place] (336) 299-7000   °Veita Bland [address], [place]  ° (336) 373-1557 Only accepts [place] Access Medicaid patients after they have  their name applied to their card.  ° °Self-Pay (no insurance) in [place]: ° °Organization         Address  Phone   Notes  °Sickle Cell Patients, [facility] [address], [place] (336) 832-1970   °[facility] [address], [place] (336) 832-4400   ° [facility] ° [address], [place] (336) 992-4800   °[facility]/Dr. Osei-Bonsu ° [address] [place] [address], [place] or [address], [place] (336) [phone] Phone number for both [place] and [place] locations is the same.  °[facility] [address], [place] (336) 299-0000   °[facility] [address] [place] [address], [place] or [address] (336) 852-7530 °(336) 878-2260   °[facility] [address], [place] (336) 350-1642, phone; (336) 294-5005, fax Sees patients 1st and 3rd Saturday of every month.  Must not qualify for public or private insurance (i.e. Medicaid, Medicare, [place] Health Choice, Veterans' Benefits) • Household income should be no more than 200% of the poverty level •The clinic cannot treat you if you are pregnant or think you are pregnant • Sexually transmitted diseases are not treated at the clinic.  ° ° °Dental Care: °Organization         Address  Phone  Notes  °[facility] [address], [place] (336) 641-6152 Accepts children up to age 21 who are enrolled in Medicaid or [place] Health Choice; pregnant women with a Medicaid card; and children who have applied for Medicaid or [place] Health Choice, but were declined, whose parents can pay a reduced fee at time of service.  °[facility] [place]  [address], [place] (336) 641-7733 Accepts children up to age 21 who are enrolled in Medicaid or [place] Health Choice; pregnant women with a Medicaid card; and children who have applied  for Medicaid or [place] Health Choice, but were declined, whose parents can pay a reduced fee at time of service.  °[facility] ° [address], [place] (336) 641-4533 Patients are seen by appointment only. Walk-ins are not accepted. [facility] will see patients 18 years of age and older. °Monday - Tuesday (8am-5pm) °Most Wednesdays (8:30-5pm) °$30 per visit, cash only  °[facility] ° [address], [place] (336) 641-4533 Patients are seen by appointment only. Walk-ins are not accepted. [facility] will see patients 18 years of age and older. °One Wednesday Evening (  Monthly: Volunteer Based).  $30 per visit, cash only  °[facility]  (919) 537-3737 for adults; Children under age 4, call Graduate Pediatric Dentistry at ([phone]) [phone]. Children aged 4-14, please call (919) [phone] to request a pediatric application. ° Dental services are provided in all areas of dental care including fillings, crowns and bridges, complete and partial dentures, implants, gum treatment, root canals, and extractions. Preventive care is also provided. Treatment is provided to both adults and children. °Patients are selected via a lottery and there is often a waiting list. °  °[facility] [address], °[place] ° (336) 763-8833 [email or web address] °  °[facility] [address], [place], [place] (336)723-1848, Ext. 123 Second and Fourth Thursday of each month, opens at 6:30 AM; Clinic ends at 9 AM.  Patients are seen on a first-come first-served basis, and a limited number are seen during each clinic.  ° °[facility] ° [address], [place], [place] (336) 723-7904   Eligibility Requirements °You must have lived in [place], [place], or [place] counties for at least the last three months. °  You cannot be eligible for state or federal sponsored healthcare insurance, including Veterans Administration,  Medicaid, or Medicare. °  You generally cannot be eligible for healthcare insurance through your employer.  °  How to apply: °Eligibility screenings are held every Tuesday and Wednesday afternoon from 1:00 pm until 4:00 pm. You do not need an appointment for the interview!  °[facility] [address], [place], [place] 336-631-2330   °[facility]  336-342-8273   °[facility]  336-703-3100   °[facility]  336-570-6415   ° °Behavioral Health Resources in the Community: °Intensive Outpatient Programs °Organization         Address  Phone  Notes  °[facility] [address], [place], [place] 336-878-6098   °[facility] [address], [place], [place] 336-832-9800   °[facility] [address], [place], [place] ° 336-882-2125   °[facility] [address],  °[place], [place] 1-800-853-5163 or 336-641-4981   °Substance Abuse Resources °Organization         Address  Phone  Notes  °[facility]  336-882-2125   °[facility]  336-784-9470   °[facility]  336-285-9073   °[facility]  336-845-3988   °Residential & Outpatient Substance Abuse Program  1-800-659-3381   °Psychological Services °Organization         Address  Phone  Notes  °[facility]  336- 832-9600   °[facility]  336- 378-7881   °[facility] [address], [place] 1-800-853-5163 or 336-641-4981   ° °Mobile Crisis Teams °Organization         Address  Phone  Notes  °[facility], Mobile Crisis Care Unit  1-877-626-1772   °Assertive °[facility] ° [address] [place], [place] 336-834-9664   °Sharon DeEsch [address] °[place] 336-554-5454   ° °Self-Help/Support Groups °Organization         Address  Phone             Notes  °[facility] [place] - variety of support  groups  336- 373-1402 Call for more information  °Narcotics Anonymous (NA), [facility] [address], °[place]  2 meetings at this location  ° °  Residential Treatment Programs °Organization         Address  Phone  Notes  °[facility] [address],    °[place]  1-866-801-8205   °[facility] ° [address], [place], [place] 704-293-8524   °[facility] [address], [place] 336-845-3988 Admissions: 8am-3pm M-F  °[facility] [address]    °[place], [place] 336-841-1104   °[facility] [address], [place], [place] 336-379-7146   °[facility] [address]  °[place], [place] 336-285-9073   °[facility] - Intensive Outpatient [address], [place], [place] 336-852-3033   °[facility] [address]  °[place], [place] 1-877-615-2722 or 336-784-9470   °[facility] [address] [place], [place] 336-227-7417 Accepts Medicaid  °[facility] [address]  °[place] 1-800-659-3381 Substance Abuse/Addiction Treatment  ° °[place] Behavioral Health Resources °Organization         Address  Phone  Notes  °[facility]  (888) 581-9988   °Julie Brannon, PhD [address] [place], [place]   (336) 349-5553 or (336) 951-0000   °[facility]   [address] °[place], [place] (336) 349-4454   °[facility] [address], [place], [place] (336) 342-8316 Insurance/Medicaid/sponsorship through [facility]  °[facility] [address]                                    [place], [place] (336) 342-8316 Therapy/tele-psych/case  °[facility] [address]  ° [place], [place] (336) 349-2233    °Dr. Arfeen  (336) 349-4544   °[facility] [place]  [employer] [place] [facility] 1) [address], [place] °2) [address], [place] °3)   [address], [place] (336) 349-3220 °(336) 342-7768 ° °(336) 342-8140   °[place] [facility] (336) 342-1394 or (336) 342-3537 (After Hours)    ° ° ° °

## 2013-05-28 NOTE — ED Notes (Signed)
Order for phenergan IV was cancelled while I was administering the medicine. Dr. Fredderick PhenixBelfi made aware and gave VO to hold the ativan for now.

## 2013-05-28 NOTE — ED Notes (Signed)
Pt unable to drink contrast. Dr. Fredderick PhenixBelfi aware and sts to scan pt with IV contrast only.
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
## 2013-11-03 ENCOUNTER — Other Ambulatory Visit: Payer: Self-pay | Admitting: Internal Medicine

## 2013-11-03 DIAGNOSIS — Z1239 Encounter for other screening for malignant neoplasm of breast: Secondary | ICD-10-CM

## 2013-11-15 ENCOUNTER — Encounter (HOSPITAL_BASED_OUTPATIENT_CLINIC_OR_DEPARTMENT_OTHER): Payer: Self-pay | Admitting: Emergency Medicine

## 2014-08-26 IMAGING — CT CT ABD-PELV W/ CM
2 of 4 series · 17 of 46 positions shown, 19 images · IV contrast (APPLIED)
Comparison: March 10, 2003

CLINICAL DATA: Nausea vomiting from this morning

EXAM:
CT ABDOMEN AND PELVIS WITH CONTRAST
TECHNIQUE: Multidetector CT imaging of the abdomen and pelvis was performed
using the standard protocol following bolus administration of
intravenous contrast.
CONTRAST:  50mL OMNIPAQUE IOHEXOL 300 MG/ML SOLN, 100mL OMNIPAQUE
IOHEXOL 300 MG/ML SOLN

[Series 2: abd/pelvis 5.0 b31f · axial · 0.63mm/px · z∈[-475,-105]mm · 14 of 82 slices shown, 16 images]
[im 4/82  soft-tissue]
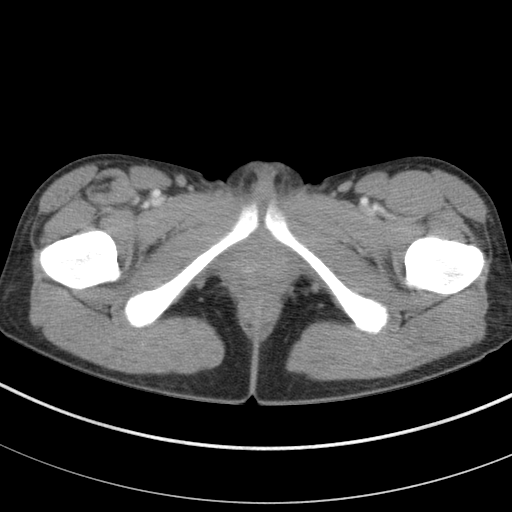
[im 4/82  bone]
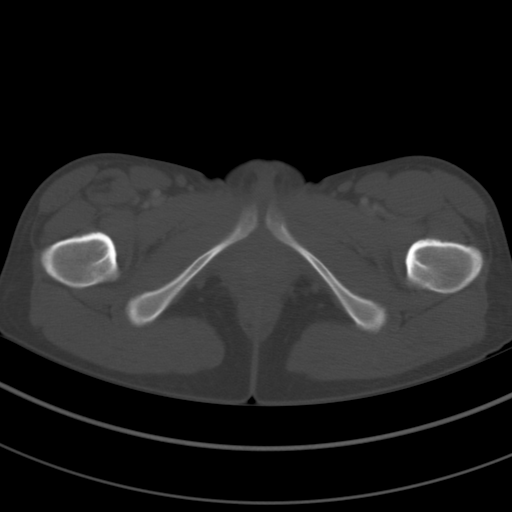
[im 11/82  soft-tissue]
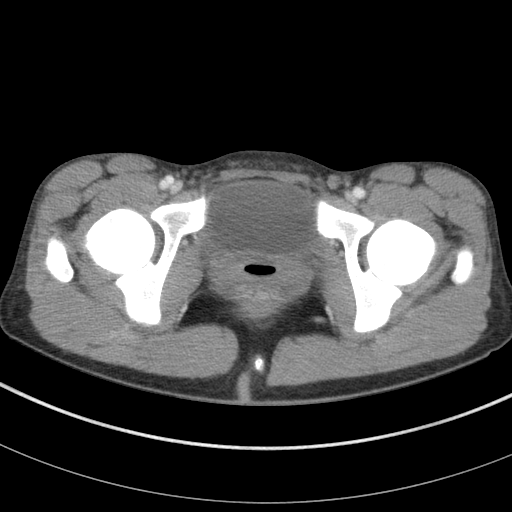
[im 17/82  soft-tissue]
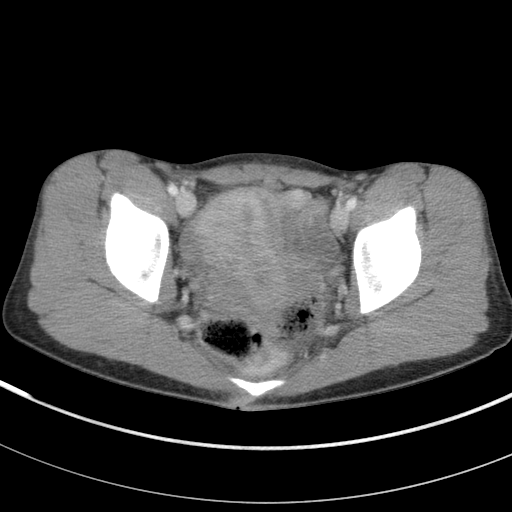
[im 21/82  soft-tissue]
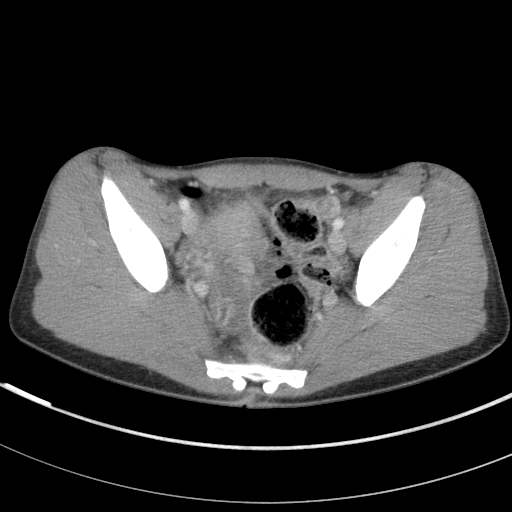
[im 28/82  soft-tissue]
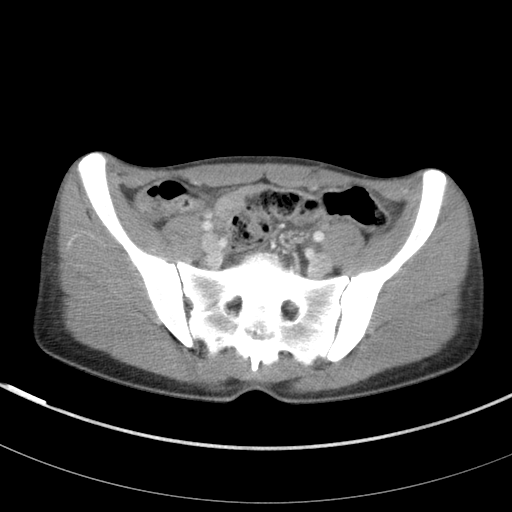
[im 34/82  soft-tissue]
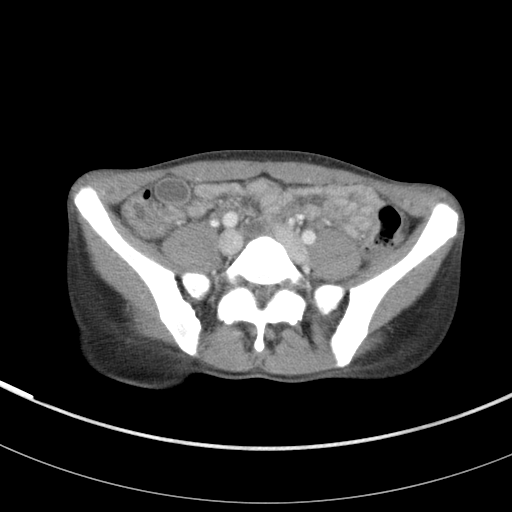
[im 38/82  soft-tissue]
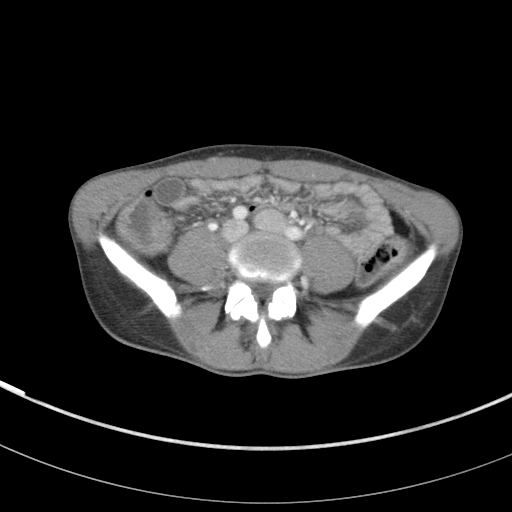
[im 44/82  soft-tissue]
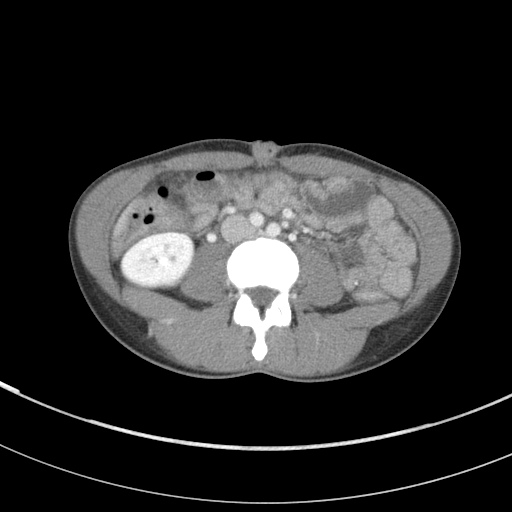
[im 48/82  soft-tissue]
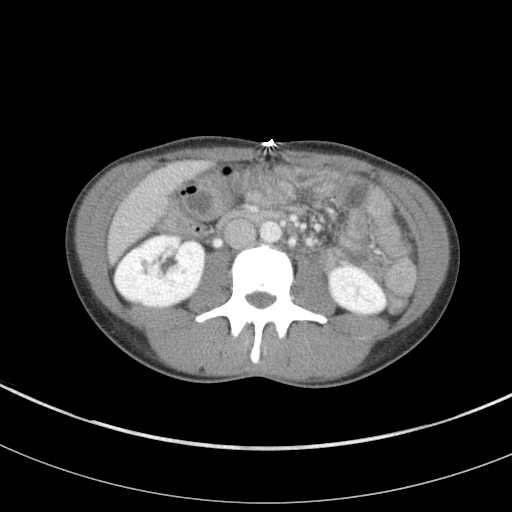
[im 48/82  bone]
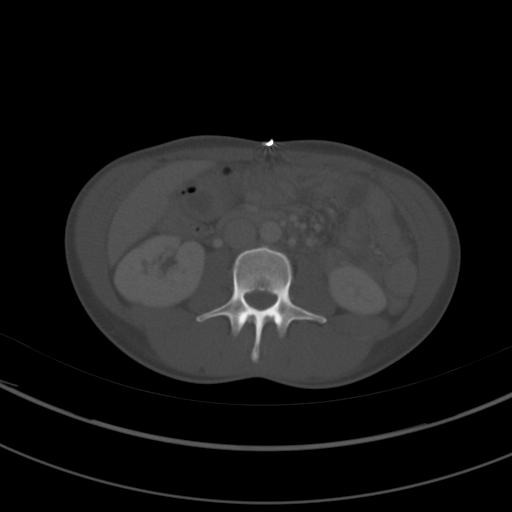
[im 55/82  soft-tissue]
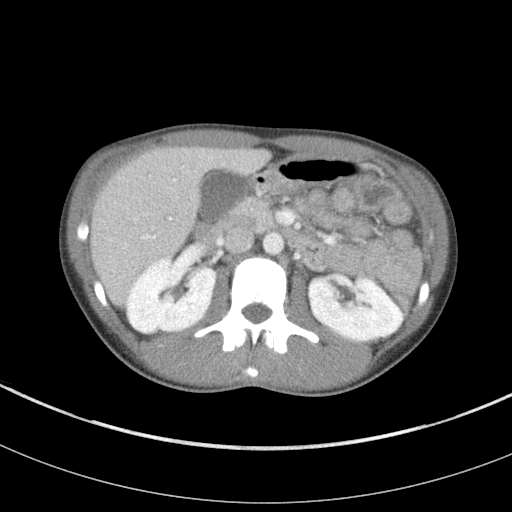
[im 61/82  soft-tissue]
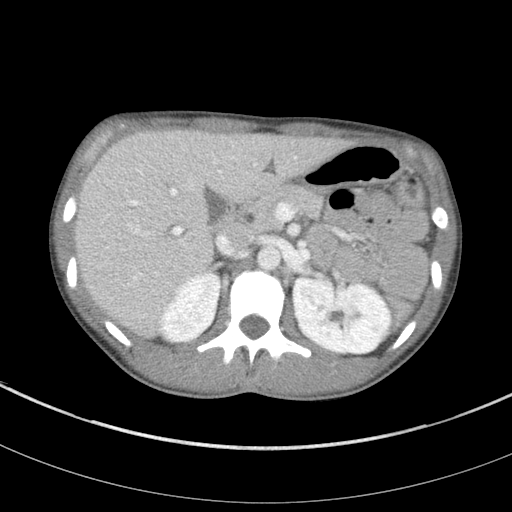
[im 65/82  soft-tissue]
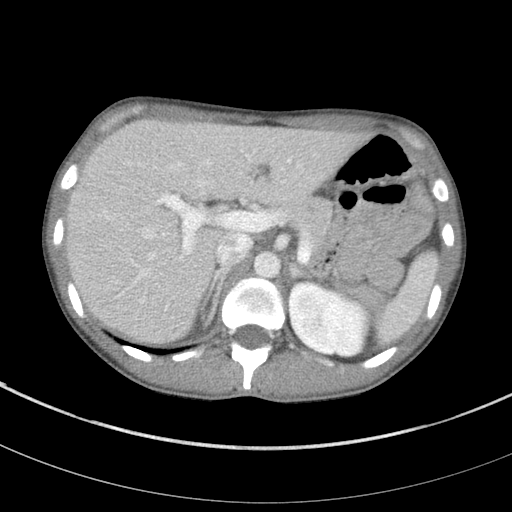
[im 71/82  soft-tissue]
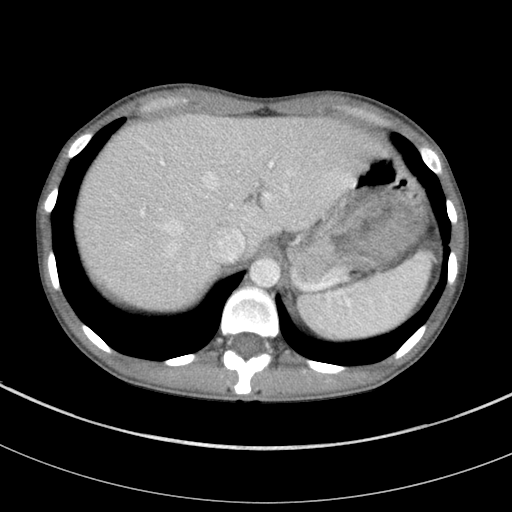
[im 78/82  soft-tissue]
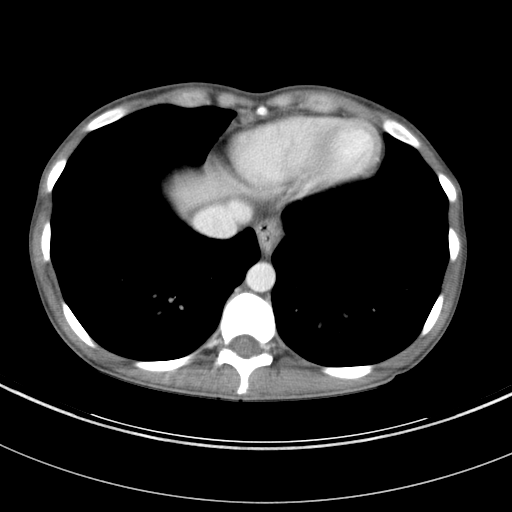

[Series 5: abd/pelvis 3.0 coronal · coronal · 0.86mm/px · 3 of 67 slices shown]
[im 23/67  soft-tissue]
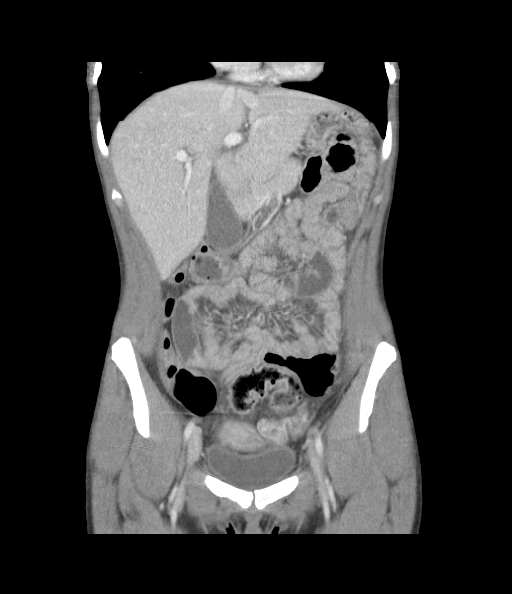
[im 30/67  soft-tissue]
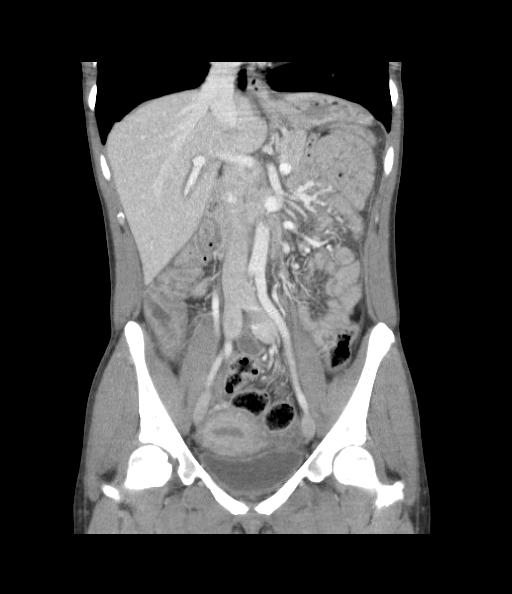
[im 37/67  soft-tissue]
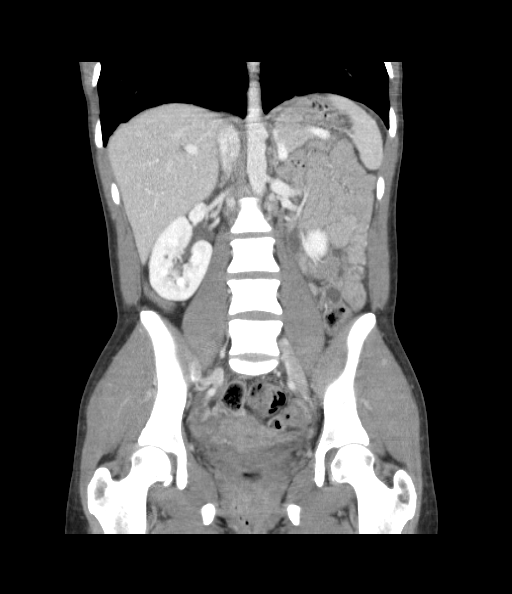

[17 of 46 positions shown; findings below may reference images not displayed]

FINDINGS: The liver, spleen, pancreas, gallbladder, adrenal glands and kidneys
are normal. There is no hydronephrosis bilaterally. The aorta is
normal. There is no abdominal lymphadenopathy. There is no small
bowel obstruction. The colon is normal. The appendix is not
definitely seen.

Fluid-filled bladder is normal. The uterus is normal. There is free
fluid within the pelvis. There are low densities within the
bilateral adnexa consistent with ovarian cyst. One of the right
ovarian cyst is probably a recently ruptured ovarian cyst measuring
2.3 x 1.8 cm. The lung bases are clear. No acute abnormality is
identified in the visualized bones.
IMPRESSION: No acute abnormality identified in the abdomen and pelvis. The
appendix not seen.

Bilateral appearing cysts, one of the right ovarian cyst is probably
a recently ruptured ovarian cyst measuring 2.3 x 1.8 cm. Small
amount free fluid in the pelvis.

## 2016-10-24 ENCOUNTER — Inpatient Hospital Stay (HOSPITAL_COMMUNITY)
Admission: AD | Admit: 2016-10-24 | Discharge: 2016-10-24 | Disposition: A | Discharge status: Home / Self Care | Payer: Medicaid Other | Source: Ambulatory Visit | Attending: Family Medicine | Admitting: Family Medicine

## 2016-10-24 ENCOUNTER — Encounter (HOSPITAL_COMMUNITY): Payer: Self-pay | Admitting: *Deleted

## 2016-10-24 DIAGNOSIS — M545 Low back pain, unspecified: Secondary | ICD-10-CM

## 2016-10-24 DIAGNOSIS — Z87440 Personal history of urinary (tract) infections: Secondary | ICD-10-CM | POA: Diagnosis not present

## 2016-10-24 DIAGNOSIS — Z8249 Family history of ischemic heart disease and other diseases of the circulatory system: Secondary | ICD-10-CM | POA: Diagnosis not present

## 2016-10-24 DIAGNOSIS — I10 Essential (primary) hypertension: Secondary | ICD-10-CM | POA: Diagnosis not present

## 2016-10-24 DIAGNOSIS — Z811 Family history of alcohol abuse and dependence: Secondary | ICD-10-CM | POA: Diagnosis not present

## 2016-10-24 DIAGNOSIS — K5900 Constipation, unspecified: Secondary | ICD-10-CM | POA: Diagnosis not present

## 2016-10-24 DIAGNOSIS — Z87891 Personal history of nicotine dependence: Secondary | ICD-10-CM | POA: Diagnosis not present

## 2016-10-24 DIAGNOSIS — N83201 Unspecified ovarian cyst, right side: Secondary | ICD-10-CM | POA: Diagnosis not present

## 2016-10-24 DIAGNOSIS — N83299 Other ovarian cyst, unspecified side: Secondary | ICD-10-CM

## 2016-10-24 DIAGNOSIS — R1031 Right lower quadrant pain: Secondary | ICD-10-CM | POA: Insufficient documentation

## 2016-10-24 DIAGNOSIS — Z87442 Personal history of urinary calculi: Secondary | ICD-10-CM | POA: Diagnosis not present

## 2016-10-24 HISTORY — DX: Unspecified ovarian cyst, unspecified side: N83.209

## 2016-10-24 LAB — URINALYSIS, ROUTINE W REFLEX MICROSCOPIC
Bacteria, UA: NONE SEEN
Bilirubin Urine: NEGATIVE
Glucose, UA: NEGATIVE mg/dL
Ketones, ur: NEGATIVE mg/dL
Leukocytes, UA: NEGATIVE
Nitrite: NEGATIVE
Protein, ur: NEGATIVE mg/dL
Specific Gravity, Urine: 1.016 (ref 1.005–1.030)
pH: 6 (ref 5.0–8.0)

## 2016-10-24 LAB — CBC
HCT: 36.9 % (ref 36.0–46.0)
Hemoglobin: 12.7 g/dL (ref 12.0–15.0)
MCH: 31.7 pg (ref 26.0–34.0)
MCHC: 34.4 g/dL (ref 30.0–36.0)
MCV: 92 fL (ref 78.0–100.0)
Platelets: 289 10*3/uL (ref 150–400)
RBC: 4.01 MIL/uL (ref 3.87–5.11)
RDW: 12.1 % (ref 11.5–15.5)
WBC: 7.4 10*3/uL (ref 4.0–10.5)

## 2016-10-24 LAB — COMPREHENSIVE METABOLIC PANEL
ALT: 30 U/L (ref 14–54)
AST: 23 U/L (ref 15–41)
Albumin: 4.5 g/dL (ref 3.5–5.0)
Alkaline Phosphatase: 34 U/L — ABNORMAL LOW (ref 38–126)
Anion gap: 6 (ref 5–15)
BUN: 16 mg/dL (ref 6–20)
CO2: 26 mmol/L (ref 22–32)
Calcium: 9.1 mg/dL (ref 8.9–10.3)
Chloride: 105 mmol/L (ref 101–111)
Creatinine, Ser: 0.72 mg/dL (ref 0.44–1.00)
GFR calc Af Amer: >60 mL/min (ref >60)
GFR calc non Af Amer: >60 mL/min (ref >60)
Glucose, Bld: 96 mg/dL (ref 65–99)
Potassium: 4.1 mmol/L (ref 3.5–5.1)
Sodium: 137 mmol/L (ref 135–145)
Total Bilirubin: 0.5 mg/dL (ref 0.3–1.2)
Total Protein: 6.8 g/dL (ref 6.5–8.1)

## 2016-10-24 LAB — WET PREP, GENITAL
Clue Cells Wet Prep HPF POC: NONE SEEN
Sperm: NONE SEEN
Trich, Wet Prep: NONE SEEN
Yeast Wet Prep HPF POC: NONE SEEN

## 2016-10-24 LAB — POCT PREGNANCY, URINE: PREG TEST UR: NEGATIVE

## 2016-10-24 MED ORDER — NAPROXEN 500 MG PO TABS: 500.0000 mg | ORAL_TABLET | Freq: Two times a day (BID) | ORAL | 0 refills | Status: AC

## 2016-10-24 NOTE — Progress Notes (Signed)
Went for pap smear last week at primary care office, but patient Had blood in urine at appt so they were unable to do the exam.  She was Sent to [facility] for CT scan that showed cyst on ovaries.  Patient having pain on right flank she rates 9/10.  States pain medicine her doctor gave her isnt helping, she thinks it was "toradol."

## 2016-10-24 NOTE — Discharge Instructions (Signed)
1. Start taking prescribed medication (Naproxen) every 12 hours  2. Buy a bottle of magnesium citrate and take this. You should have bowel movement in about 4-8 hours. If no results, you can take a second bottle. 3. Start taking Miralax daily  4. Follow up with our primary care provider in the next couple of week (sooner if your symptoms are not improving). You will need to have a urinalysis repeated to see if there is still blood in the urine.    Constipation, Adult Constipation is when a person:  Poops (has a bowel movement) fewer times in a week than normal.  Has a hard time pooping.  Has poop that is dry, hard, or bigger than normal.  Follow these instructions at home: Eating and drinking   Eat foods that have a lot of fiber, such as: ? Fresh fruits and vegetables. ? Whole grains. ? Beans.  Eat less of foods that are high in fat, low in fiber, or overly processed, such as: ? [place] fries. ? Hamburgers. ? Cookies. ? Candy. ? Soda.  Drink enough fluid to keep your pee (urine) clear or pale yellow. General instructions  Exercise regularly or as told by your doctor.  Go to the restroom when you feel like you need to poop. Do not hold it in.  Take over-the-counter and prescription medicines only as told by your doctor. These include any fiber supplements.  Do pelvic floor retraining exercises, such as: ? Doing deep breathing while relaxing your lower belly (abdomen). ? Relaxing your pelvic floor while pooping.  Watch your condition for any changes.  Keep all follow-up visits as told by your doctor. This is important. Contact a doctor if:  You have pain that gets worse.  You have a fever.  You have not pooped for 4 days.  You throw up (vomit).  You are not hungry.  You lose weight.  You are bleeding from the anus.  You have thin, pencil-like poop (stool). Get help right away if:  You have a fever, and your symptoms suddenly get worse.  You leak poop  or have blood in your poop.  Your belly feels hard or bigger than normal (is bloated).  You have very bad belly pain.  You feel dizzy or you faint. This information is not intended to replace advice given to you by your health care provider. Make sure you discuss any questions you have with your health care provider. Document Released: 06/19/2007 Document Revised: 07/21/2015 Document Reviewed: 06/21/2015 Elsevier Interactive Patient Education  2017 [employer].
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
## 2016-10-25 LAB — URINE CULTURE: Culture: NO GROWTH

## 2016-10-25 LAB — GC/CHLAMYDIA PROBE AMP (~~LOC~~) NOT AT ARMC
Chlamydia: NEGATIVE
Neisseria Gonorrhea: NEGATIVE

## 2017-03-13 ENCOUNTER — Telehealth: Payer: Self-pay

## 2017-03-13 NOTE — Telephone Encounter (Signed)
Patient will keep her appt tomorrow.

## 2017-03-14 ENCOUNTER — Ambulatory Visit (INDEPENDENT_AMBULATORY_CARE_PROVIDER_SITE_OTHER): Payer: Medicaid Other | Admitting: Obstetrics and Gynecology

## 2017-03-14 ENCOUNTER — Encounter: Payer: Self-pay | Admitting: Obstetrics and Gynecology

## 2017-03-14 DIAGNOSIS — N83201 Unspecified ovarian cyst, right side: Secondary | ICD-10-CM | POA: Diagnosis not present

## 2017-03-14 DIAGNOSIS — N83209 Unspecified ovarian cyst, unspecified side: Secondary | ICD-10-CM | POA: Insufficient documentation

## 2017-03-14 NOTE — Patient Instructions (Signed)
Ovarian Cyst An ovarian cyst is a fluid-filled sac on an ovary. The ovaries are organs that make eggs in women. Most ovarian cysts go away on their own and are not cancerous (are benign). Some cysts need treatment. Follow these instructions at home:  Take over-the-counter and prescription medicines only as told by your doctor.  Do not drive or use heavy machinery while taking prescription pain medicine.  Get pelvic exams and Pap tests as often as told by your doctor.  Return to your normal activities as told by your doctor. Ask your doctor what activities are safe for you.  Do not use any products that contain nicotine or tobacco, such as cigarettes and e-cigarettes. If you need help quitting, ask your doctor.  Keep all follow-up visits as told by your doctor. This is important. Contact a doctor if:  Your periods are: ? Late. ? Irregular. ? Painful.  Your periods stop.  You have pelvic pain that does not go away.  You have pressure on your bladder.  You have trouble making your bladder empty when you pee (urinate).  You have pain during sex.  You have any of the following in your belly (abdomen): ? A feeling of fullness. ? Pressure. ? Discomfort. ? Pain that does not go away. ? Swelling.  You feel sick most of the time.  You have trouble pooping (have constipation).  You are not as hungry as usual (you lose your appetite).  You get very bad acne.  You start to have more hair on your body and face.  You are gaining weight or losing weight without changing your exercise and eating habits.  You think you may be pregnant. Get help right away if:  You have belly pain that is very bad or gets worse.  You cannot eat or drink without throwing up (vomiting).  You suddenly get a fever.  Your period is a lot heavier than usual. This information is not intended to replace advice given to you by your health care provider. Make sure you discuss any questions you have  with your health care provider. Document Released: 06/19/2007 Document Revised: 07/21/2015 Document Reviewed: 06/04/2015 Elsevier Interactive Patient Education  2018 [employer]  

## 2017-03-14 NOTE — Progress Notes (Signed)
Patient ID: Heather PortelaBrandy L Xiong, female   DOB: 1985-03-15, 32 y.o.   MRN: [id number] [name] presents with right side/pelvic pain for the last six months. Pt reports pain begins about 1 week prior to cycle and then resolves with cycle. H/O ovarian cyst/functional follicles in the past. U/S of 11/18 unremarkable except for functioning cyst. She does report some constipation. No bladder dysfunction LMP 1 week ago Cycles monthly TSVD x 1 No chronic medical problems Last pap smear 2 weeks ago  PE AF VSS Lungs clear Heart RRR Abd soft + BS non tender no masses GU nl EGBUS uterus small mobile non tender no masses  A/P Right side/pelvic pain        Ovarian cyst history of  Discussed ovarian cyst with pt and partner. Tx options reviewed to include OCP's or PRN NSAID's. Do not feel that follow up U/S indicated at this time Pt declines treatment at this time but will call back to office if she changes her mind O/W follow up PRN

## 2017-03-14 NOTE — Progress Notes (Signed)
Pt presents for problem visit today  Pt c/o: pelvic pain/ ovarian cyst  Has had AEX at PCP up to date on pap 2 weeks ago WNL  When in pain per pt 6/10x  Pt has had recent U/S and CT scan in 10/2016 and 11/2016.

## 2017-03-31 ENCOUNTER — Telehealth: Payer: Self-pay

## 2017-03-31 NOTE — Telephone Encounter (Signed)
Return call to pt regarding message  Pt was not ava Pt mother asked I call back after 3pm.

## 2017-04-03 ENCOUNTER — Telehealth: Payer: Self-pay

## 2017-04-03 NOTE — Telephone Encounter (Signed)
Additional TC to pt regarding message for Dr.Ervin and Rx request for pain w/ cyst. LVOVM for pt to contact the office.

## 2017-04-10 ENCOUNTER — Telehealth: Payer: Self-pay

## 2017-04-10 MED ORDER — NAPROXEN SODIUM 550 MG PO TABS: 550.0000 mg | ORAL_TABLET | Freq: Two times a day (BID) | ORAL | 1 refills | Status: DC | PRN

## 2017-04-10 NOTE — Telephone Encounter (Signed)
Contacted pt to advise of rx sent by provider, left vm.

## 2019-05-06 DIAGNOSIS — F321 Major depressive disorder, single episode, moderate: Secondary | ICD-10-CM | POA: Insufficient documentation

## 2019-05-06 DIAGNOSIS — R5381 Other malaise: Secondary | ICD-10-CM | POA: Insufficient documentation

## 2019-05-06 DIAGNOSIS — F410 Panic disorder [episodic paroxysmal anxiety] without agoraphobia: Secondary | ICD-10-CM | POA: Insufficient documentation

## 2019-05-06 DIAGNOSIS — F4312 Post-traumatic stress disorder, chronic: Secondary | ICD-10-CM | POA: Insufficient documentation

## 2020-08-05 DIAGNOSIS — K5904 Chronic idiopathic constipation: Secondary | ICD-10-CM | POA: Insufficient documentation

## 2020-08-05 DIAGNOSIS — A5901 Trichomonal vulvovaginitis: Secondary | ICD-10-CM | POA: Insufficient documentation

## 2020-08-05 DIAGNOSIS — Z3A34 34 weeks gestation of pregnancy: Secondary | ICD-10-CM | POA: Insufficient documentation

## 2022-01-19 ENCOUNTER — Emergency Department (HOSPITAL_COMMUNITY)
Admission: EM | Admit: 2022-01-19 | Discharge: 2022-01-20 | Disposition: A | Discharge status: Home / Self Care | Payer: Medicaid Other | Attending: Emergency Medicine | Admitting: Emergency Medicine

## 2022-01-19 ENCOUNTER — Other Ambulatory Visit: Payer: Self-pay

## 2022-01-19 ENCOUNTER — Encounter (HOSPITAL_COMMUNITY): Payer: Self-pay

## 2022-01-19 DIAGNOSIS — F10120 Alcohol abuse with intoxication, uncomplicated: Secondary | ICD-10-CM | POA: Diagnosis present

## 2022-01-19 DIAGNOSIS — Y906 Blood alcohol level of 120-199 mg/100 ml: Secondary | ICD-10-CM | POA: Insufficient documentation

## 2022-01-19 DIAGNOSIS — F10920 Alcohol use, unspecified with intoxication, uncomplicated: Secondary | ICD-10-CM

## 2022-01-19 DIAGNOSIS — Z91148 Patient's other noncompliance with medication regimen for other reason: Secondary | ICD-10-CM

## 2022-01-19 LAB — CBC
HCT: 36.9 % (ref 36.0–46.0)
Hemoglobin: 12.9 g/dL (ref 12.0–15.0)
MCH: 33.1 pg (ref 26.0–34.0)
MCHC: 35 g/dL (ref 30.0–36.0)
MCV: 94.6 fL (ref 80.0–100.0)
Platelets: 485 10*3/uL — ABNORMAL HIGH (ref 150–400)
RBC: 3.9 MIL/uL (ref 3.87–5.11)
RDW: 11.9 % (ref 11.5–15.5)
WBC: 10.7 10*3/uL — ABNORMAL HIGH (ref 4.0–10.5)
nRBC: 0 % (ref 0.0–0.2)

## 2022-01-19 LAB — ACETAMINOPHEN LEVEL: Acetaminophen (Tylenol), Serum: <10 ug/mL — ABNORMAL LOW (ref 10–30)

## 2022-01-19 LAB — ETHANOL: Alcohol, Ethyl (B): 139 mg/dL — ABNORMAL HIGH (ref <10)

## 2022-01-19 LAB — SALICYLATE LEVEL: Salicylate Lvl: <7 mg/dL — ABNORMAL LOW (ref 7.0–30.0)

## 2022-01-19 NOTE — ED Provider Notes (Signed)
[facility]-EMERGENCY DEPT Provider Note   CSN: [id number] Arrival date & time: 01/19/22  2050     History  Chief Complaint  Patient presents with   Alcohol Intoxication    Heather Daugherty is a 37 y.o. female.  Patient with c/o that she feels she drank too much alcohol and took and extra klonopin.  Pt not able to quantify the amount of etoh she consumed. Pt denies heavy/daily use. Denies other drug use. Denies wanting to harm self, denies SI.   Pt denies specific physical c/o. No headache. No chest pain or sob. No abd pain.   The history is provided by the patient, medical records and the EMS personnel. The history is limited by the condition of the patient.  Alcohol Intoxication Pertinent negatives include no chest pain, no abdominal pain, no headaches and no shortness of breath.       Home Medications Prior to Admission medications   Medication Sig Start Date End Date Taking? Authorizing Provider  naproxen sodium (ANAPROX DS) 550 MG tablet Take 1 tablet (550 mg total) by mouth 2 (two) times daily as needed. 04/10/17   Hermina Staggers, MD      Allergies    Patient has no known allergies.    Review of Systems   Review of Systems  Constitutional:  Negative for fever.  HENT:  Negative for sore throat.   Eyes:  Negative for visual disturbance.  Respiratory:  Negative for shortness of breath.   Cardiovascular:  Negative for chest pain.  Gastrointestinal:  Negative for abdominal pain and vomiting.  Genitourinary:  Negative for flank pain.  Musculoskeletal:  Negative for back pain and neck pain.  Skin:  Negative for rash.  Neurological:  Negative for headaches.  Hematological:  Does not bruise/bleed easily.    Physical Exam Updated Vital Signs BP 104/80   Pulse 90   Temp 97.8 F (36.6 C) (Oral)   Resp 18   Ht 1.499 m (4\' 11" )   Wt 63 kg   SpO2 97%   BMI 28.07 kg/m  Physical Exam Vitals and nursing note reviewed.  Constitutional:       Appearance: Normal appearance. She is well-developed.  HENT:     Head: Atraumatic.     Nose: Nose normal.     Mouth/Throat:     Mouth: Mucous membranes are moist.  Eyes:     General: No scleral icterus.    Pupils: Pupils are equal, round, and reactive to light.  Neck:     Trachea: No tracheal deviation.  Cardiovascular:     Rate and Rhythm: Normal rate and regular rhythm.     Pulses: Normal pulses.     Heart sounds: Normal heart sounds. No murmur heard.    No friction rub. No gallop.  Pulmonary:     Effort: Pulmonary effort is normal. No respiratory distress.     Breath sounds: Normal breath sounds.  Abdominal:     General: Bowel sounds are normal. There is no distension.     Palpations: Abdomen is soft.     Tenderness: There is no abdominal tenderness.  Genitourinary:    Comments: No cva tenderness.  Musculoskeletal:        General: No swelling or tenderness.     Cervical back: Normal range of motion and neck supple. No rigidity. No muscular tenderness.     Right lower leg: No edema.     Left lower leg: No edema.  Skin:  General: Skin is warm and dry.     Findings: No rash.  Neurological:     Mental Status: She is alert.     Comments: Alert, speech normal. Motor/sens grossly intact bil.   Psychiatric:        Mood and Affect: Mood normal.     ED Results / Procedures / Treatments   Labs (all labs ordered are listed, but only abnormal results are displayed) Results for orders placed or performed during the hospital encounter of 01/19/22  CBC  Result Value Ref Range   WBC 10.7 (H) 4.0 - 10.5 K/uL   RBC 3.90 3.87 - 5.11 MIL/uL   Hemoglobin 12.9 12.0 - 15.0 g/dL   HCT 36.9 36.0 - 46.0 %   MCV 94.6 80.0 - 100.0 fL   MCH 33.1 26.0 - 34.0 pg   MCHC 35.0 30.0 - 36.0 g/dL   RDW 11.9 11.5 - 15.5 %   Platelets 485 (H) 150 - 400 K/uL   nRBC 0.0 0.0 - 0.2 %  Ethanol  Result Value Ref Range   Alcohol, Ethyl (B) 139 (H) <10 mg/dL  Acetaminophen level  Result Value Ref  Range   Acetaminophen (Tylenol), Serum <10 (L) 10 - 30 ug/mL  Salicylate level  Result Value Ref Range   Salicylate Lvl <7.2 (L) 7.0 - 30.0 mg/dL     EKG None  Radiology No results found.  Procedures Procedures    Medications Ordered in ED Medications - No data to display  ED Course/ Medical Decision Making/ A&P                           Medical Decision Making Problems Addressed: Acute alcoholic intoxication without complication ([place]): acute illness or injury with systemic symptoms that poses a threat to life or bodily functions Overuse of medication: acute illness or injury  Amount and/or Complexity of Data Reviewed Independent Historian: EMS    Details: hx External Data Reviewed: notes. Labs: ordered. Decision-making details documented in ED Course.  Risk Decision regarding hospitalization.   Iv ns. Continuous pulse ox and cardiac monitoring. Labs ordered/sent.   Differential diagnosis includes  etoh intoxication, dehydration, overuse medication, etc. Dispo decision including potential need for admission considered - will get labs and reassess.   Reviewed nursing notes and prior charts for additional history. External reports reviewed. Additional history from: EMS.   Cardiac monitor: sinus rhythm, rate 80.  Labs reviewed/interpreted by me - hgb normal. Etoh high.   2240, recheck, pt alert, no acute distress, labs pending.   2300 signed out to Dr Florina Ou to check pending labs, recheck pt, and dispo appropriately.             Final Clinical Impression(s) / ED Diagnoses Final diagnoses:  Acute alcoholic intoxication without complication ([place])  Overuse of medication    Rx / DC Orders ED Discharge Orders     None         Lajean Saver, MD 01/19/22 2301

## 2022-01-19 NOTE — Discharge Instructions (Addendum)
It was our pleasure to provide your ER care today - we hope that you feel better.  Avoid  alcohol use as it is harmful to your physical health and mental well-being. Make sure to never take medication in over the prescribed or recommended amount. See resource guide attached in terms of accessing inpatient or outpatient substance use treatment programs.   Follow up closely with primary care doctor and behavioral health provider in the coming week.  For mental health issues and/or crisis, you may also go directly to the [facility] - they are open 24/7 and walk-ins are welcome.    Return to ER if worse, new symptoms, fevers, chest pain, trouble breathing, or other emergency concern.

## 2022-01-19 NOTE — ED Triage Notes (Addendum)
States that she thinks she had too much to drink along with klonopin. Had 2 times more than she should have. Has had 500 of fluids and 4 of zofran

## 2022-01-20 LAB — COMPREHENSIVE METABOLIC PANEL
ALT: 26 U/L (ref 0–44)
AST: 19 U/L (ref 15–41)
Albumin: 3.3 g/dL — ABNORMAL LOW (ref 3.5–5.0)
Alkaline Phosphatase: 35 U/L — ABNORMAL LOW (ref 38–126)
Anion gap: 8 (ref 5–15)
BUN: 11 mg/dL (ref 6–20)
CO2: 23 mmol/L (ref 22–32)
Calcium: 7.9 mg/dL — ABNORMAL LOW (ref 8.9–10.3)
Chloride: 111 mmol/L (ref 98–111)
Creatinine, Ser: 0.59 mg/dL (ref 0.44–1.00)
GFR, Estimated: >60 mL/min (ref >60)
Glucose, Bld: 112 mg/dL — ABNORMAL HIGH (ref 70–99)
Potassium: 3.5 mmol/L (ref 3.5–5.1)
Sodium: 142 mmol/L (ref 135–145)
Total Bilirubin: 0.3 mg/dL (ref 0.3–1.2)
Total Protein: 6.5 g/dL (ref 6.5–8.1)

## 2022-01-20 LAB — RAPID URINE DRUG SCREEN, HOSP PERFORMED
Amphetamines: NOT DETECTED
Barbiturates: NOT DETECTED
Benzodiazepines: POSITIVE — AB
Cocaine: NOT DETECTED
Opiates: NOT DETECTED
Tetrahydrocannabinol: POSITIVE — AB

## 2022-01-20 MED ORDER — METOCLOPRAMIDE HCL 5 MG/ML IJ SOLN
10.0000 mg | Freq: Once | INTRAMUSCULAR | Status: AC | PRN
  Administered 2022-01-20: 10 mg via INTRAVENOUS
  Filled 2022-01-20: qty 2

## 2022-01-20 MED ORDER — DROPERIDOL 2.5 MG/ML IJ SOLN
2.5000 mg | Freq: Once | INTRAMUSCULAR | Status: AC
  Administered 2022-01-20: 2.5 mg via INTRAVENOUS
  Filled 2022-01-20: qty 2

## 2022-01-20 MED ORDER — ONDANSETRON HCL 4 MG/2ML IJ SOLN
4.0000 mg | Freq: Once | INTRAMUSCULAR | Status: AC
  Administered 2022-01-20: 4 mg via INTRAVENOUS
  Filled 2022-01-20: qty 2

## 2022-01-20 MED ORDER — SODIUM CHLORIDE 0.9 % IV BOLUS
1000.0000 mL | Freq: Once | INTRAVENOUS | Status: AC
  Administered 2022-01-20: 1000 mL via INTRAVENOUS

## 2022-01-20 NOTE — ED Provider Notes (Signed)
4:17 AM Patient developed nausea and vomiting requiring multiple antiemetics.  She finally got relief with droperidol.  She is awake and alert and ready to go home at this time.     Gohan Collister, Jenny Reichmann, MD 01/20/22 [phone]

## 2023-01-23 DIAGNOSIS — D75839 Thrombocytosis, unspecified: Secondary | ICD-10-CM | POA: Insufficient documentation

## 2023-03-06 ENCOUNTER — Encounter: Payer: Self-pay | Admitting: Nurse Practitioner

## 2023-03-06 ENCOUNTER — Inpatient Hospital Stay: Payer: Medicaid Other

## 2023-03-06 ENCOUNTER — Inpatient Hospital Stay: Payer: Medicaid Other | Attending: Nurse Practitioner | Admitting: Nurse Practitioner

## 2023-03-06 VITALS — BP 139/93 | HR 90 | Temp 98.8°F | Resp 18 | Ht 60.24 in | Wt 150.7 lb

## 2023-03-06 DIAGNOSIS — Z79899 Other long term (current) drug therapy: Secondary | ICD-10-CM | POA: Diagnosis not present

## 2023-03-06 DIAGNOSIS — F32A Depression, unspecified: Secondary | ICD-10-CM | POA: Insufficient documentation

## 2023-03-06 DIAGNOSIS — D72829 Elevated white blood cell count, unspecified: Secondary | ICD-10-CM | POA: Insufficient documentation

## 2023-03-06 DIAGNOSIS — F1290 Cannabis use, unspecified, uncomplicated: Secondary | ICD-10-CM | POA: Diagnosis not present

## 2023-03-06 DIAGNOSIS — D75839 Thrombocytosis, unspecified: Secondary | ICD-10-CM

## 2023-03-06 DIAGNOSIS — F419 Anxiety disorder, unspecified: Secondary | ICD-10-CM | POA: Insufficient documentation

## 2023-03-06 DIAGNOSIS — Z803 Family history of malignant neoplasm of breast: Secondary | ICD-10-CM

## 2023-03-06 DIAGNOSIS — D7589 Other specified diseases of blood and blood-forming organs: Secondary | ICD-10-CM

## 2023-03-06 DIAGNOSIS — D75838 Other thrombocytosis: Secondary | ICD-10-CM | POA: Diagnosis present

## 2023-03-06 LAB — RETIC PANEL
Immature Retic Fract: 6.6 % (ref 2.3–15.9)
RBC.: 4.07 MIL/uL (ref 3.87–5.11)
Retic Count, Absolute: 79.4 K/uL (ref 19.0–186.0)
Retic Ct Pct: 2 % (ref 0.4–3.1)
Reticulocyte Hemoglobin: 36.8 pg

## 2023-03-06 LAB — CBC WITH DIFFERENTIAL/PLATELET
Abs Immature Granulocytes: 0.02 K/uL (ref 0.00–0.07)
Basophils Absolute: 0.1 K/uL (ref 0.0–0.1)
Basophils Relative: 1 %
Eosinophils Absolute: 0.2 K/uL (ref 0.0–0.5)
Eosinophils Relative: 2 %
HCT: 38.7 % (ref 36.0–46.0)
Hemoglobin: 13.5 g/dL (ref 12.0–15.0)
Immature Granulocytes: 0 %
Lymphocytes Relative: 24 %
Lymphs Abs: 2.1 K/uL (ref 0.7–4.0)
MCH: 32.5 pg (ref 26.0–34.0)
MCHC: 34.9 g/dL (ref 30.0–36.0)
MCV: 93 fL (ref 80.0–100.0)
Monocytes Absolute: 0.6 K/uL (ref 0.1–1.0)
Monocytes Relative: 7 %
Neutro Abs: 5.6 K/uL (ref 1.7–7.7)
Neutrophils Relative %: 66 %
Platelets: 423 K/uL — ABNORMAL HIGH (ref 150–400)
RBC: 4.16 MIL/uL (ref 3.87–5.11)
RDW: 12.2 % (ref 11.5–15.5)
WBC: 8.7 K/uL (ref 4.0–10.5)
nRBC: 0 % (ref 0.0–0.2)
nRBC: 0 /100{WBCs}

## 2023-03-06 LAB — IRON AND TIBC
Iron: 260 ug/dL — ABNORMAL HIGH (ref 28–170)
Saturation Ratios: 61 % — ABNORMAL HIGH (ref 10.4–31.8)
TIBC: 427 ug/dL (ref 250–450)
UIBC: 167 ug/dL

## 2023-03-06 LAB — COMPREHENSIVE METABOLIC PANEL WITH GFR
ALT: 24 U/L (ref 0–44)
AST: 27 U/L (ref 15–41)
Albumin: 4.1 g/dL (ref 3.5–5.0)
Alkaline Phosphatase: 57 U/L (ref 38–126)
Anion gap: 13 (ref 5–15)
BUN: 12 mg/dL (ref 6–20)
CO2: 22 mmol/L (ref 22–32)
Calcium: 9.1 mg/dL (ref 8.9–10.3)
Chloride: 103 mmol/L (ref 98–111)
Creatinine, Ser: 0.71 mg/dL (ref 0.44–1.00)
GFR, Estimated: >60 mL/min
Glucose, Bld: 96 mg/dL (ref 70–99)
Potassium: 3.7 mmol/L (ref 3.5–5.1)
Sodium: 137 mmol/L (ref 135–145)
Total Bilirubin: 0.5 mg/dL (ref 0.0–1.2)
Total Protein: 7.2 g/dL (ref 6.5–8.1)

## 2023-03-06 LAB — TECHNOLOGIST SMEAR REVIEW: Plt Morphology: NORMAL

## 2023-03-06 LAB — SEDIMENTATION RATE: Sed Rate: 2 mm/h (ref 0–22)

## 2023-03-06 LAB — VITAMIN B12: Vitamin B-12: 206 pg/mL (ref 180–914)

## 2023-03-06 LAB — FERRITIN: Ferritin: 36 ng/mL (ref 11–307)

## 2023-03-06 LAB — C-REACTIVE PROTEIN: CRP: 0.6 mg/dL

## 2023-03-06 MED ORDER — ASPIRIN 81 MG PO TBEC
81.0000 mg | DELAYED_RELEASE_TABLET | Freq: Every day | ORAL | 3 refills | Status: AC
Start: 2023-03-06 — End: ?
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
## 2023-03-07 NOTE — Telephone Encounter (Signed)
 Called and left VM for patient to call back to schedule an appointment.

## 2023-03-08 ENCOUNTER — Telehealth: Payer: Self-pay | Admitting: Genetic Counselor

## 2023-03-08 NOTE — Telephone Encounter (Signed)
 Called patient to set up Genetic counseling appointment. Patient does not have a voicemail box set up.

## 2023-03-11 ENCOUNTER — Telehealth: Payer: Self-pay | Admitting: Nurse Practitioner

## 2023-03-11 LAB — BCR-ABL1 FISH
Cells Analyzed: 200
Cells Counted: 200

## 2023-03-11 NOTE — Telephone Encounter (Signed)
 Spoke to patient by phone. Reviewed that kidney stone symptoms are best handled by pcp and unrelated to her hematology findings. Also briefly reviewed results which are overall reassuring and likely reflective of reactive process. She'll follow up in March as planned.

## 2023-03-13 ENCOUNTER — Encounter: Payer: Self-pay | Admitting: Genetic Counselor

## 2023-03-13 ENCOUNTER — Inpatient Hospital Stay (HOSPITAL_BASED_OUTPATIENT_CLINIC_OR_DEPARTMENT_OTHER): Payer: Medicaid Other | Admitting: Genetic Counselor

## 2023-03-13 ENCOUNTER — Inpatient Hospital Stay: Payer: Medicaid Other

## 2023-03-13 DIAGNOSIS — Z1379 Encounter for other screening for genetic and chromosomal anomalies: Secondary | ICD-10-CM | POA: Diagnosis not present

## 2023-03-13 DIAGNOSIS — Z803 Family history of malignant neoplasm of breast: Secondary | ICD-10-CM

## 2023-03-13 LAB — GENETIC SCREENING ORDER
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
## 2023-03-14 LAB — JAK2 V617F RFX CALR/MPL/E12-15

## 2023-03-14 LAB — CALR +MPL + E12-E15  (REFLEX)

## 2023-03-20 ENCOUNTER — Inpatient Hospital Stay: Payer: Medicaid Other

## 2023-03-20 ENCOUNTER — Inpatient Hospital Stay: Payer: Medicaid Other | Admitting: Oncology

## 2023-03-31 ENCOUNTER — Ambulatory Visit: Payer: Self-pay | Admitting: Genetic Counselor

## 2023-03-31 ENCOUNTER — Encounter: Payer: Self-pay | Admitting: Genetic Counselor

## 2023-03-31 DIAGNOSIS — Z803 Family history of malignant neoplasm of breast: Secondary | ICD-10-CM

## 2023-03-31 DIAGNOSIS — Z1379 Encounter for other screening for genetic and chromosomal anomalies: Secondary | ICD-10-CM

## 2023-03-31 NOTE — Progress Notes (Signed)
 HPI:   Ms. Wimbish was previously seen in the [facility] due to a family history of breast cancer and concerns regarding a hereditary predisposition to cancer.    Ms. Junio recent genetic test results were disclosed to her by telephone. These results and recommendations are discussed in more detail below.  CANCER HISTORY:  Oncology History   No history exists.    FAMILY HISTORY:  We obtained a detailed, 4-generation family history.  Significant diagnoses are listed below:      Family History  Problem Relation Age of Onset   Breast cancer Paternal Grandmother          dx 30s   Skin cancer Paternal Grandfather          unknown type; dx > 50; face/back       Ms. Rosenwald is unaware of previous family history of genetic testing for hereditary cancer risks. Other relatives are unavailable for genetic testing at this time.    There is no reported Ashkenazi Jewish ancestry. There is no known consanguinity.  GENETIC TEST RESULTS:  The [facility] CustomNext-Cancer +RNAinsight Panel found no pathogenic mutations.   The [facility] CustomNext-Cancer +RNAinsight Panel (CancerNext + melanoma genes) includes sequencing, deletion/duplication, and RNA analysis for the following 43 genes: APC, ATM, BAP1, BARD1, BMPR1A, BRCA1, BRCA2, BRIP1, CDH1, CDK4, CDKN2A, CHEK2, FH, FLCN, MET, MLH1, MSH2, MSH6, MUTYH, NF1, NTHL1, PALB2, PMS2, POT1, PTEN, RAD51C, RAD51D, RB1, SMAD4, STK11, TP53, TSC1, TSC2 and VHL (sequencing and deletion/duplication); AXIN2, HOXB13, MBD4, MITF, MSH3, POLD1 and POLE (sequencing only); EPCAM and GREM1 (deletion/duplication only).   The test report has been scanned into EPIC and is located under the Molecular Pathology section of the Results Review tab.  A portion of the result report is included below for reference. Genetic testing reported out on March 30, 2023.       Even though a pathogenic variant was not identified, possible explanations for the cancer in the family  may include: There may be no hereditary risk for cancer in the family. The cancers in Ms. Self's family may be sporadic/familial or due to other genetic and environmental factors.  Most cancer is not hereditary.  There may be a gene mutation in one of these genes that current testing methods cannot detect but that chance is small. There could be another gene that has not yet been discovered, or that we have not yet tested, that is responsible for the cancer diagnoses in the family.  It is also possible there is a hereditary cause for the cancer in the family that Ms. Pesch did not inherit.   Therefore, it is important to remain in touch with cancer genetics in the future so that we can continue to offer Ms. Musco the most up to date genetic testing.    ADDITIONAL GENETIC TESTING:   Ms. Buendia genetic testing was fairly extensive.  If there are additional relevant genes identified to increase cancer risk that can be analyzed in the future, we would be happy to discuss and coordinate this testing at that time.     CANCER SCREENING RECOMMENDATIONS:  Ms. Marion test result is considered negative (normal).  This means that we have not identified a hereditary cause for her family history of breast cancer at this time.   An individual's cancer risk and medical management are not determined by genetic test results alone. Overall cancer risk assessment incorporates additional factors, including personal medical history, family history, and any available genetic information that may  result in a personalized plan for cancer prevention and surveillance. Therefore, it is recommended she continue to follow the cancer management and screening guidelines provided by her primary healthcare provider.  Her lifetime risk for breast cancer based on Tyrer-Cuzick risk model and reported personal/family history is 15.7%.  This is elevated compared to the general population (10.9% for her age group) but not in the 'high  risk for breast cancer' category per NCCN guidelines (>20%).  This risk estimate can change over time and may be repeated to reflect new information in her personal or family history in the future.  She should speak with her primary care provider about beginning annual mammograms now, given that her paternal grandmother was diagnosed with breast cancer in her 30s.        RECOMMENDATIONS FOR FAMILY MEMBERS:   Since she did not inherit a identifiable mutation in a cancer predisposition gene included on this panel, her children could not have inherited a known mutation from her in one of these genes. Individuals in this family might be at some increased risk of developing cancer, over the general population risk, due to the family history of cancer.  Individuals in the family should notify their providers of the family history of cancer. We recommend women in this family have a yearly mammogram beginning at age 47, or 79 years younger than the earliest onset of cancer, an annual clinical breast exam, and perform monthly breast self-exams.  Risk models that take into account family history and hormonal history may be helpful in determining appropriate breast cancer screening options for family members.  Other members of the family may still carry a pathogenic variant in one of these genes that Ms. Vandehei did not inherit. Based on the family history of breast cancer in her paternal grandmother in her 70s, we recommend her father and paternal aunts have genetic counseling and testing. Ms. Laske can let us know if we can be of any assistance in coordinating genetic counseling and/or testing for these family members.     FOLLOW-UP:  Cancer genetics is a rapidly advancing field and it is possible that new genetic tests will be appropriate for her and/or her family members in the future. We encourage Ms. Pellecchia to remain in contact with cancer genetics, so we can update her personal and family histories and let her  know of advances in cancer genetics that may benefit this family.   Our contact number was provided.  They are welcome to call us at anytime with additional questions or concerns.   Chari Parmenter M. Rennie Plowman, MS, [facility] Genetic Counselor [email or web address] (P) [phone]

## 2023-04-04 ENCOUNTER — Inpatient Hospital Stay: Admitting: Oncology

## 2023-04-04 ENCOUNTER — Telehealth: Payer: Self-pay | Admitting: Oncology

## 2023-04-04 ENCOUNTER — Inpatient Hospital Stay: Attending: Nurse Practitioner

## 2023-04-04 ENCOUNTER — Other Ambulatory Visit: Payer: Self-pay | Admitting: Oncology

## 2023-04-04 VITALS — BP 125/78 | HR 57 | Temp 98.4°F | Resp 14 | Ht 60.2 in | Wt 150.9 lb

## 2023-04-04 DIAGNOSIS — D75839 Thrombocytosis, unspecified: Secondary | ICD-10-CM | POA: Insufficient documentation

## 2023-04-04 DIAGNOSIS — D72829 Elevated white blood cell count, unspecified: Secondary | ICD-10-CM | POA: Diagnosis present

## 2023-04-04 LAB — CMP (CANCER CENTER ONLY)
ALT: 29 U/L (ref 0–44)
AST: 26 U/L (ref 15–41)
Albumin: 4.3 g/dL (ref 3.5–5.0)
Alkaline Phosphatase: 58 U/L (ref 38–126)
Anion gap: 13 (ref 5–15)
BUN: 16 mg/dL (ref 6–20)
CO2: 22 mmol/L (ref 22–32)
Calcium: 9 mg/dL (ref 8.9–10.3)
Chloride: 101 mmol/L (ref 98–111)
Creatinine: 0.67 mg/dL (ref 0.44–1.00)
GFR, Estimated: >60 mL/min (ref >60)
Glucose, Bld: 133 mg/dL — ABNORMAL HIGH (ref 70–99)
Potassium: 3.7 mmol/L (ref 3.5–5.1)
Sodium: 137 mmol/L (ref 135–145)
Total Bilirubin: 0.3 mg/dL (ref 0.0–1.2)
Total Protein: 6.9 g/dL (ref 6.5–8.1)

## 2023-04-04 LAB — IRON AND TIBC
Iron: 135 ug/dL (ref 28–170)
Saturation Ratios: 32 % — ABNORMAL HIGH (ref 10.4–31.8)
TIBC: 426 ug/dL (ref 250–450)
UIBC: 291 ug/dL

## 2023-04-04 LAB — CBC WITH DIFFERENTIAL (CANCER CENTER ONLY)
Abs Immature Granulocytes: 0.03 10*3/uL (ref 0.00–0.07)
Basophils Absolute: 0.1 10*3/uL (ref 0.0–0.1)
Basophils Relative: 1 %
Eosinophils Absolute: 0.2 10*3/uL (ref 0.0–0.5)
Eosinophils Relative: 3 %
HCT: 37 % (ref 36.0–46.0)
Hemoglobin: 12.9 g/dL (ref 12.0–15.0)
Immature Granulocytes: 0 %
Lymphocytes Relative: 26 %
Lymphs Abs: 1.9 10*3/uL (ref 0.7–4.0)
MCH: 32.8 pg (ref 26.0–34.0)
MCHC: 34.9 g/dL (ref 30.0–36.0)
MCV: 94.1 fL (ref 80.0–100.0)
Monocytes Absolute: 0.6 10*3/uL (ref 0.1–1.0)
Monocytes Relative: 8 %
Neutro Abs: 4.6 10*3/uL (ref 1.7–7.7)
Neutrophils Relative %: 62 %
Platelet Count: 418 10*3/uL — ABNORMAL HIGH (ref 150–400)
RBC: 3.93 MIL/uL (ref 3.87–5.11)
RDW: 12.3 % (ref 11.5–15.5)
WBC Count: 7.5 10*3/uL (ref 4.0–10.5)
nRBC: 0 % (ref 0.0–0.2)
nRBC: 0 /100{WBCs}

## 2023-04-04 LAB — FERRITIN: Ferritin: 22 ng/mL (ref 11–307)

## 2023-04-04 NOTE — Progress Notes (Signed)
 [facility]  [address] [place],  [place]  [address] [phone]  Clinic Day:  04/04/2023  Referring physician: Audie Pinto, FNP   HISTORY OF PRESENT ILLNESS:  The patient is a 38 y.o. female who our office recently began seeing for leukocytosis and thrombocythemia.  She comes in today to go over all of her recent labs to determine if there is any particularly ominous reason behind her elevated counts.  Since her last visit, the patient has been doing well.  Of note, her elevated counts were initially found shortly after she was diagnosed with the flu.  PHYSICAL EXAM:  Blood pressure 125/78, pulse (!) 57, temperature 98.4 F (36.9 C), temperature source Oral, resp. rate 14, height 5' 0.2" (1.529 m), weight 150 lb 14.4 oz (68.4 kg), SpO2 96%, unknown if currently breastfeeding. Wt Readings from Last 3 Encounters:  04/04/23 150 lb 14.4 oz (68.4 kg)  03/06/23 150 lb 11.2 oz (68.4 kg)  01/19/22 139 lb (63 kg)   Body mass index is 29.28 kg/m. Performance status (ECOG): 0 - Asymptomatic Physical Exam Constitutional:      Appearance: Normal appearance. She is not ill-appearing.  HENT:     Mouth/Throat:     Mouth: Mucous membranes are moist.     Pharynx: Oropharynx is clear. No oropharyngeal exudate or posterior oropharyngeal erythema.  Cardiovascular:     Rate and Rhythm: Normal rate and regular rhythm.     Heart sounds: No murmur heard.    No friction rub. No gallop.  Pulmonary:     Effort: Pulmonary effort is normal. No respiratory distress.     Breath sounds: Normal breath sounds. No wheezing, rhonchi or rales.  Abdominal:     General: Bowel sounds are normal. There is no distension.     Palpations: Abdomen is soft. There is no mass.     Tenderness: There is no abdominal tenderness.  Musculoskeletal:        General: No swelling.     Right lower leg: No edema.     Left lower leg: No edema.  Lymphadenopathy:     Cervical: No cervical  adenopathy.     Upper Body:     Right upper body: No supraclavicular or axillary adenopathy.     Left upper body: No supraclavicular or axillary adenopathy.     Lower Body: No right inguinal adenopathy. No left inguinal adenopathy.  Skin:    General: Skin is warm.     Coloration: Skin is not jaundiced.     Findings: No lesion or rash.  Neurological:     General: No focal deficit present.     Mental Status: She is alert and oriented to person, place, and time. Mental status is at baseline.  Psychiatric:        Mood and Affect: Mood normal.        Behavior: Behavior normal.        Thought Content: Thought content normal.     LABS:      Latest Ref Rng & Units 04/04/2023    1:44 PM 03/06/2023   11:38 AM 01/19/2022   10:08 PM  CBC  WBC 4.0 - 10.5 K/uL 7.5  8.7  10.7   Hemoglobin 12.0 - 15.0 g/dL 91.4  78.2  95.6   Hematocrit 36.0 - 46.0 % 37.0  38.7  36.9   Platelets 150 - 400 K/uL 418  423  485       Latest Ref Rng &  Units 04/04/2023    1:44 PM 03/06/2023   11:38 AM 01/19/2022   11:38 PM  CMP  Glucose 70 - 99 mg/dL 782  96  956   BUN 6 - 20 mg/dL 16  12  11    Creatinine 0.44 - 1.00 mg/dL 2.13  0.86  5.78   Sodium 135 - 145 mmol/L 137  137  142   Potassium 3.5 - 5.1 mmol/L 3.7  3.7  3.5   Chloride 98 - 111 mmol/L 101  103  111   CO2 22 - 32 mmol/L 22  22  23    Calcium 8.9 - 10.3 mg/dL 9.0  9.1  7.9   Total Protein 6.5 - 8.1 g/dL 6.9  7.2  6.5   Total Bilirubin 0.0 - 1.2 mg/dL 0.3  0.5  0.3   Alkaline Phos 38 - 126 U/L 58  57  35   AST 15 - 41 U/L 26  27  19    ALT 0 - 44 U/L 29  24  26      Latest Reference Range & Units 03/06/23 11:39  Iron 28 - 170 ug/dL 469 (H)  UIBC ug/dL 629  TIBC 528 - 413 ug/dL 244  Saturation Ratios 10.4 - 31.8 % 61 (H)  Ferritin 11 - 307 ng/mL 36  (H): Data is abnormally high  Latest Reference Range & Units 03/06/23 11:38  Vitamin B12 180 - 914 pg/mL 206   JAK2 V617F Result Comment  Comment: (NOTE) NEGATIVE The JAK2 V617F mutation is not  detected in the provided specimen of this individual.   FISH Result ABL1 GENE FUSION  Comment: Comment: NO BCR  Interpretation Comment:  Comment: (NOTE) NEGATIVE             nuc ish 9q34(ASS1,ABL1)x2,22q11.2(BCRx2)[200].      The fluorescence in situ hybridization (FISH) study was normal.   CALR Result Comment  Comment: (NOTE) NEGATIVE No insertions or deletions were detected within the analyzed region of the calreticulin (CALR) gene. A negative result does not entirely exclude the possibility of a clonal population carrying CALR gene mutations that are not covered by this assay. Results should be interpreted in conjunction with clinical and laboratory findings for the most accurate interpretation.  MPL Result Comment  Comment: (NOTE) NEGATIVE No MPL mutation was identified in the provided specimen of this individual. Results should be interpreted in conjunction with clinical and other laboratory findings for the most accurate interpretation.  E12-15 Result Comment  Comment: (NOTE) NEGATIVE    JAK2 mutations were not detected in exons 12, 13, 14 and 15. The G to T nucleotide change encoding the V617F mutation was not detected. This result does not rule out the presence of JAK2 mutation at a level below the detection sensitivity of this assay, the presence of other mutations outside the analyzed region of the JAK2 gene, or the presence of a myeloproliferative or other neoplasm. Result must be correlated with other clinical data for the most accurate diagnosis.   ASSESSMENT & PLAN:  Assessment/Plan:  A 38 y.o. female who our office recently has been following for leukocytosis and thrombocythemia.  In clinic today, I went over all of her recent studies with her, which showed that she does not have an underlying myeloproliferative disorder.  Furthermore, her white count has normalized.  Her platelet count is only minimally elevated.  Overall, I do not get the sense any type of  underlying hematologic disorder is present.  As mentioned previously, her elevated labs were initially  found shortly after she was diagnosed with the flu, which could have been the etiology behind her elevated counts.  As she is clinically doing well, I will see her back in 1 year for repeat clinical assessment.  The patient understands all the plans discussed today and is in agreement with them.    Edilia Ghuman Kirby Funk, MD

## 2023-04-04 NOTE — Telephone Encounter (Signed)
 04/04/23 Spoke with patient and confirmed next appt.

## 2024-04-06 ENCOUNTER — Ambulatory Visit: Admitting: Oncology

## 2024-04-06 ENCOUNTER — Other Ambulatory Visit
